# Patient Record
Sex: Female | Born: 1970 | Race: White | Hispanic: No | Marital: Married | Smoking: Former smoker
Health system: Southern US, Community
[De-identification: ages and names within clinical notes are randomized; demographics above are authoritative.]

## PROBLEM LIST (undated history)

## (undated) DIAGNOSIS — O039 Complete or unspecified spontaneous abortion without complication: Secondary | ICD-10-CM

## (undated) DIAGNOSIS — K3532 Acute appendicitis with perforation and localized peritonitis, without abscess: Secondary | ICD-10-CM

## (undated) DIAGNOSIS — K219 Gastro-esophageal reflux disease without esophagitis: Secondary | ICD-10-CM

## (undated) DIAGNOSIS — N390 Urinary tract infection, site not specified: Secondary | ICD-10-CM

## (undated) DIAGNOSIS — R51 Headache: Secondary | ICD-10-CM

## (undated) HISTORY — DX: Urinary tract infection, site not specified: N39.0

## (undated) HISTORY — DX: Acute appendicitis with perforation, localized peritonitis, and gangrene, without abscess: K35.32

## (undated) HISTORY — PX: DIAGNOSTIC LAPAROSCOPY: SUR761

## (undated) HISTORY — PX: DILATION AND CURETTAGE OF UTERUS: SHX78

## (undated) HISTORY — DX: Complete or unspecified spontaneous abortion without complication: O03.9

## (undated) HISTORY — PX: APPENDECTOMY: SHX54

---

## 1998-01-12 ENCOUNTER — Ambulatory Visit (HOSPITAL_COMMUNITY): Admission: RE | Admit: 1998-01-12 | Discharge: 1998-01-12 | Payer: Self-pay | Admitting: Obstetrics and Gynecology

## 1999-10-22 ENCOUNTER — Other Ambulatory Visit: Admission: RE | Admit: 1999-10-22 | Discharge: 1999-10-22 | Payer: Self-pay | Admitting: Obstetrics and Gynecology

## 2000-08-23 ENCOUNTER — Encounter: Payer: Self-pay | Admitting: Emergency Medicine

## 2000-08-23 ENCOUNTER — Inpatient Hospital Stay (HOSPITAL_COMMUNITY): Admission: EM | Admit: 2000-08-23 | Discharge: 2000-08-24 | Payer: Self-pay | Admitting: Emergency Medicine

## 2000-08-23 ENCOUNTER — Encounter (INDEPENDENT_AMBULATORY_CARE_PROVIDER_SITE_OTHER): Payer: Self-pay | Admitting: *Deleted

## 2000-09-18 ENCOUNTER — Encounter: Payer: Self-pay | Admitting: Obstetrics and Gynecology

## 2000-09-18 ENCOUNTER — Ambulatory Visit (HOSPITAL_COMMUNITY): Admission: RE | Admit: 2000-09-18 | Discharge: 2000-09-18 | Payer: Self-pay | Admitting: Obstetrics and Gynecology

## 2001-01-20 ENCOUNTER — Other Ambulatory Visit: Admission: RE | Admit: 2001-01-20 | Discharge: 2001-01-20 | Payer: Self-pay | Admitting: Obstetrics and Gynecology

## 2001-05-03 ENCOUNTER — Inpatient Hospital Stay (HOSPITAL_COMMUNITY): Admission: AD | Admit: 2001-05-03 | Discharge: 2001-05-03 | Payer: Self-pay | Admitting: *Deleted

## 2001-06-22 ENCOUNTER — Inpatient Hospital Stay (HOSPITAL_COMMUNITY): Admission: AD | Admit: 2001-06-22 | Discharge: 2001-06-22 | Payer: Self-pay | Admitting: Obstetrics and Gynecology

## 2001-06-24 ENCOUNTER — Inpatient Hospital Stay (HOSPITAL_COMMUNITY): Admission: AD | Admit: 2001-06-24 | Discharge: 2001-06-24 | Payer: Self-pay | Admitting: Obstetrics and Gynecology

## 2001-06-27 ENCOUNTER — Inpatient Hospital Stay (HOSPITAL_COMMUNITY): Admission: AD | Admit: 2001-06-27 | Discharge: 2001-06-27 | Payer: Self-pay | Admitting: Obstetrics & Gynecology

## 2001-07-11 ENCOUNTER — Inpatient Hospital Stay (HOSPITAL_COMMUNITY): Admission: AD | Admit: 2001-07-11 | Discharge: 2001-07-11 | Payer: Self-pay | Admitting: Obstetrics and Gynecology

## 2001-07-20 ENCOUNTER — Observation Stay (HOSPITAL_COMMUNITY): Admission: AD | Admit: 2001-07-20 | Discharge: 2001-07-20 | Payer: Self-pay | Admitting: Obstetrics and Gynecology

## 2001-07-20 ENCOUNTER — Inpatient Hospital Stay (HOSPITAL_COMMUNITY): Admission: AD | Admit: 2001-07-20 | Discharge: 2001-07-23 | Payer: Self-pay | Admitting: Obstetrics and Gynecology

## 2001-07-24 ENCOUNTER — Encounter: Admission: RE | Admit: 2001-07-24 | Discharge: 2001-08-23 | Payer: Self-pay | Admitting: Obstetrics and Gynecology

## 2001-08-25 ENCOUNTER — Other Ambulatory Visit: Admission: RE | Admit: 2001-08-25 | Discharge: 2001-08-25 | Payer: Self-pay | Admitting: Obstetrics and Gynecology

## 2001-12-23 ENCOUNTER — Encounter: Admission: RE | Admit: 2001-12-23 | Discharge: 2001-12-23 | Payer: Self-pay | Admitting: Family Medicine

## 2001-12-23 ENCOUNTER — Encounter: Payer: Self-pay | Admitting: Family Medicine

## 2002-12-15 ENCOUNTER — Other Ambulatory Visit: Admission: RE | Admit: 2002-12-15 | Discharge: 2002-12-15 | Payer: Self-pay | Admitting: Obstetrics and Gynecology

## 2003-01-11 ENCOUNTER — Inpatient Hospital Stay (HOSPITAL_COMMUNITY): Admission: AD | Admit: 2003-01-11 | Discharge: 2003-01-11 | Payer: Self-pay | Admitting: Obstetrics and Gynecology

## 2004-01-31 ENCOUNTER — Ambulatory Visit (HOSPITAL_BASED_OUTPATIENT_CLINIC_OR_DEPARTMENT_OTHER): Admission: RE | Admit: 2004-01-31 | Discharge: 2004-01-31 | Payer: Self-pay | Admitting: Internal Medicine

## 2004-02-16 ENCOUNTER — Other Ambulatory Visit: Admission: RE | Admit: 2004-02-16 | Discharge: 2004-02-16 | Payer: Self-pay | Admitting: Obstetrics and Gynecology

## 2005-12-02 ENCOUNTER — Other Ambulatory Visit: Admission: RE | Admit: 2005-12-02 | Discharge: 2005-12-02 | Payer: Self-pay | Admitting: Obstetrics and Gynecology

## 2006-11-11 ENCOUNTER — Inpatient Hospital Stay (HOSPITAL_COMMUNITY): Admission: AD | Admit: 2006-11-11 | Discharge: 2006-11-11 | Payer: Self-pay | Admitting: Obstetrics and Gynecology

## 2006-12-17 ENCOUNTER — Encounter (INDEPENDENT_AMBULATORY_CARE_PROVIDER_SITE_OTHER): Payer: Self-pay | Admitting: Specialist

## 2006-12-17 ENCOUNTER — Ambulatory Visit (HOSPITAL_COMMUNITY): Admission: RE | Admit: 2006-12-17 | Discharge: 2006-12-17 | Payer: Self-pay | Admitting: Obstetrics and Gynecology

## 2008-04-12 ENCOUNTER — Ambulatory Visit (HOSPITAL_COMMUNITY): Admission: RE | Admit: 2008-04-12 | Discharge: 2008-04-12 | Payer: Self-pay | Admitting: Obstetrics and Gynecology

## 2011-04-04 NOTE — Op Note (Signed)
[place]  Patient:    GILBERTO, STRECK Visit Number: [id number] MRN: [id number]          Service Type: MED Location: [phone] Attending Physician:  Glenna Fellows Tappan Proc. Date: 08/23/00 Admit Date:  08/23/2000                             Operative Report  PREOPERATIVE DIAGNOSIS:  Acute appendicitis.  POSTOPERATIVE DIAGNOSIS:  Acute appendicitis.  PROCEDURE:  Laparoscopic appendectomy.  SURGEON:  Lorne Skeens. Hoxworth, M.D.  ANESTHESIA:  General.  BRIEF HISTORY:  The patient is a 40 year old white female who presented with signs and symptoms compatible with acute appendicitis which had been confirmed by CT scan.  Laparoscopic appendectomy has been recommended and accepted.  The nature of the procedure, its indications, and risks of bleeding and infection were discussed and understood preoperatively.  She is now brought to the operating room for this procedure.  DESCRIPTION OF PROCEDURE:  The patient was brought to the operating room and placed in the supine position on the operating table and general endotracheal anesthesia was induced.  Broad spectrum antibiotics had been given preoperatively.  The abdomen was sterilely prepped and draped.  A Foley catheter was placed.  An open Roseanne Reno approach was used through a small umbilical incision through a pursestring suture of 0 Vicryl.  Under direct vision, a 5 mm trocar was placed in the right upper quadrant and a 12 mm trocar placed in the left lower quadrant.  The appendix then was visualized laterally and was acutely inflamed without gangrene or perforation.  The appendix was mobilized, dividing lateral peritoneal attachments and was completely freed and elevated.  The mesoappendix was dissected away from the appendix at its base.  The mesoappendix was divided with a single firing of the Endo-GIA vascular stapler and there was no bleeding from the staple line. The base of the  appendix was not inflamed and was well-exposed.  The appendix was divided at its base with a second firing of the Endo-GIA 3.5 mm stapler.  Both staple lines were inspected and were intact.  The right lower quadrant was irrigated and inspected for hemostasis which was complete.  The appendix was placed in an Endocatch bag and brought out through the umbilicus.  All CO2 was evacuated from the peritoneal cavity and the pursestring suture was secured at the umbilicus.  Skin incisions were closed with a running subcuticular 4-0 Vicryl and Steri-Strips.  Sponge and needle counts were correct.  A dry sterile dressing was applied and the patient was taken to the recovery room in good condition. Attending Physician:  Delsa Bern DD:  08/23/00 TD:  08/24/00 Job: [id number]

## 2011-04-04 NOTE — Op Note (Signed)
NAMEREKITA, MIOTKE                ACCOUNT NO.:  [id number]   MEDICAL RECORD NO.:  [id number]          PATIENT TYPE:  AMB   LOCATION:  SDC                           FACILITY:  [facility]   PHYSICIAN:  Guy Sandifer. Henderson Cloud, M.D. DATE OF BIRTH:  [birth year]   DATE OF PROCEDURE:  12/17/2006  DATE OF DISCHARGE:                               OPERATIVE REPORT   PREOPERATIVE DIAGNOSIS:  Spontaneous abortion.   POSTOPERATIVE DIAGNOSIS:  Spontaneous abortion.   PROCEDURE:  Dilation and curettage and 1% Xylocaine paracervical block.   SURGEON:  Harold Hedge, M.D.   ANESTHESIA:  MAC.   SPECIMENS:  Products of conception to pathology and for karyotype.   ESTIMATED BLOOD LOSS:  Minimal.   INDICATIONS AND CONSENT:  This patient is a 40 year old, married, white  female, G4, P1, with a spontaneous miscarriage.  Alternate methods of  management have been discussed, and the patient requests dilatation and  evacuation.  Potential risks and complications have been discussed  preoperatively.   PROCEDURE:  The patient taken to the operating room where she was  identified, placed in dorsal supine position and given intravenous  sedation.  She is then placed in the dorsal lithotomy position, prepped,  bladder straight-catheterized and draped in a sterile fashion.  Examination reveals the uterus to be 6-8 weeks in size.  Bivalve  speculum is placed in the vagina.  Anterior cervical lip is injected  with 1% Xylocaine plain and grasped with a single-tooth tenaculum.  Paracervical block is then placed at the 2, 4, 5, 7, 8, and 10 o'clock  positions with approximately 20 mL total of plain 1% Xylocaine.  Cervix  was gently progressively dilated to a 31 dilator.  Number 7 curved  curettage is then passed and suction curettage is carried out for  obvious products of conception.  Twenty units of Pitocin are added to 1  liter of IV fluids after the initial pass of the curette.  Alternating  sharp and suction curettage  is carried out until the cavity is clean and  good hemostasis is noted.  All instruments are removed.  All counts are  correct.  Portion of the products of conception will be sent for  karyotype.      Guy Sandifer Henderson Cloud, M.D.  Electronically Signed     JET/MEDQ  D:  12/17/2006  T:  12/17/2006  Job:  [id number]

## 2011-07-25 ENCOUNTER — Encounter: Payer: Self-pay | Admitting: Internal Medicine

## 2011-08-18 ENCOUNTER — Telehealth: Payer: Self-pay | Admitting: Internal Medicine

## 2011-08-18 ENCOUNTER — Ambulatory Visit: Payer: Self-pay | Admitting: Internal Medicine

## 2011-08-18 NOTE — Telephone Encounter (Signed)
Do not bill 

## 2011-09-04 ENCOUNTER — Encounter: Payer: Self-pay | Admitting: Internal Medicine

## 2011-09-10 ENCOUNTER — Encounter: Payer: Self-pay | Admitting: Internal Medicine

## 2011-09-12 ENCOUNTER — Telehealth: Payer: Self-pay | Admitting: Internal Medicine

## 2011-09-12 ENCOUNTER — Ambulatory Visit: Payer: Self-pay | Admitting: Internal Medicine

## 2011-09-12 NOTE — Telephone Encounter (Signed)
No do not bill  

## 2011-10-01 ENCOUNTER — Ambulatory Visit: Payer: Self-pay | Admitting: Internal Medicine

## 2011-10-01 ENCOUNTER — Telehealth: Payer: Self-pay | Admitting: Internal Medicine

## 2011-12-30 ENCOUNTER — Telehealth: Payer: Self-pay | Admitting: Internal Medicine

## 2011-12-30 NOTE — Telephone Encounter (Signed)
Pt reports painful , bleeding hemorrhoids and the anusol suppositories don't seem to be helping. She reports she is showers frequently d/t the pain. Instructed pt on Sitz baths instead of showering to try to shrink the hemorrhoids. Pt r/s to tomorrow with Dr Rhea Belton.

## 2011-12-31 ENCOUNTER — Encounter: Payer: Self-pay | Admitting: Internal Medicine

## 2011-12-31 ENCOUNTER — Ambulatory Visit (INDEPENDENT_AMBULATORY_CARE_PROVIDER_SITE_OTHER): Payer: BC Managed Care – PPO | Admitting: Internal Medicine

## 2011-12-31 VITALS — BP 98/60 | HR 80 | Ht 67.0 in | Wt 194.8 lb

## 2011-12-31 DIAGNOSIS — K648 Other hemorrhoids: Secondary | ICD-10-CM

## 2011-12-31 DIAGNOSIS — K649 Unspecified hemorrhoids: Secondary | ICD-10-CM | POA: Insufficient documentation

## 2011-12-31 DIAGNOSIS — K602 Anal fissure, unspecified: Secondary | ICD-10-CM | POA: Insufficient documentation

## 2011-12-31 MED ORDER — HYDROCORTISONE ACE-PRAMOXINE 2.5-1 % RE CREA: TOPICAL_CREAM | Freq: Three times a day (TID) | RECTAL | Status: AC

## 2011-12-31 MED ORDER — DILTIAZEM GEL 2 %: 1.0000 "application " | Freq: Three times a day (TID) | CUTANEOUS | Status: DC

## 2011-12-31 MED ORDER — LIDOCAINE 5 % EX OINT: TOPICAL_OINTMENT | Freq: Three times a day (TID) | CUTANEOUS | Status: DC | PRN

## 2011-12-31 NOTE — Progress Notes (Signed)
Subjective:    Patient ID: Casey Perez, female    DOB: [birth year], 41 y.o.   MRN: [id number]  HPI Mrs. Stillman is a 41 year old female with little PMH who presents for evaluation of anal pain and intermittent rectal bleeding.  The patient reports she's had issues with "hemorrhoids" since 2008, however over the last 4 or 5 months her symptoms have become more severe. She was scheduled to see me in October 2012, but ended up canceling this appointment because her symptoms improved transiently.  Her symptoms returned approximately 3 or 4 weeks ago, and this prompted her to reschedule. Reports trouble with perianal burning, itching, some leakage of mucus, and occasional bright red blood with wiping. She reports extreme pain with passage of bowel movement. Currently she is having approximately 2 bowel movements daily, her stools are formed but not hard. She denies constipation or diarrhea. Her perianal pain is often worse for a few minutes to an hour after bowel movement, and is sometimes worse with sitting. She denies abdominal pain. No nausea and vomiting. Occasional heartburn, but this is not a problem for her. No dysphagia. No fevers or chills.  Regarding her anorectum, she is using diltiazem gel 4 times daily over the last 3 weeks. She reports this helps some, but has not been completely effective. She has been using ibuprofen 800 mg every 8 hours, and she feels this has helped more than anything.  Review of Systems As per history of present illness, otherwise negative  Past Medical History  Diagnosis Date  . Ruptured appendix   . Hemorrhoids   . UTI (lower urinary tract infection)   . Miscarriage    Past Surgical History  Procedure Date  . Appendectomy    Current Outpatient Prescriptions  Medication Sig Dispense Refill  . busPIRone (BUSPAR) 10 MG tablet Take 10 mg by mouth 2 (two) times daily.      Marland Kitchen diltiazem 2 % GEL Apply 1 application topically 3 (three) times daily.  30 g  1    Allergies  Allergen Reactions  . Sulfa Antibiotics    Family History  Problem Relation Age of Onset  . Hypertension Father   . Diabetes Father    Social History  . Marital Status: Married    Number of Children: 1   Social History Main Topics  . Smoking status: Former Smoker    Quit date: 02/28/2011  . Smokeless tobacco: Never Used  . Alcohol Use: 2.0 oz/week    4 drink(s) per week     occasional  . Drug Use: No      Objective:   Physical Exam BP 98/60  Pulse 80  Ht 5\' 7"  (1.702 m)  Wt 194 lb 12.8 oz (88.361 kg)  BMI 30.51 kg/m2  LMP 12/17/2011 Constitutional: Well-developed and well-nourished. No distress. HEENT: Normocephalic and atraumatic. Oropharynx is clear and moist. No oropharyngeal exudate. Conjunctivae are normal. Pupils are equal round and reactive to light. No scleral icterus. Neck: Neck supple. Trachea midline. Cardiovascular: Normal rate, regular rhythm and intact distal pulses. No M/R/G Pulmonary/chest: Effort normal and breath sounds normal. No wheezing, rales or rhonchi. Abdominal: Soft, nontender, nondistended. Bowel sounds active throughout. There are no masses palpable. No hepatosplenomegaly. Rectal: Posterior midline superficial laceration consistent with fissure, there is also a small superficial fissure anteriorly, internal exam somewhat limited by pain with presence of formed brown stool in the vault, no masses. No external hemorrhoids seen. Extremities: no clubbing, cyanosis, or edema Lymphadenopathy: No cervical adenopathy noted. Neurological: Alert  and oriented to person place and time. Skin: Skin is warm and dry. No rashes noted. Psychiatric: Normal mood and affect. Behavior is normal.     Assessment & Plan:  41 year old female with little PMH who presents for evaluation of anal pain and intermittent rectal bleeding  1. Anorectal pain -- the patient's pain and symptoms are most consistent with anal fissure as seen on rectal exam. It  certainly is possible she has internal hemorrhoids as well.  Anal fissures can take as long as 6 weeks to heal, and she is using diltiazem gel 3 times a day.  I've recommended that she continue this to total 6 weeks. I also will give her prescription for Analpram to be used 3 times daily.  Finally I will give her some topical lidocaine 2% gel to help with the pain.  If her symptoms fail to improve, we could try a different therapy such as topical nitroglycerin.  Also, I may consider flexible sigmoidoscopy with moderate sedation to obtain a better exam.  We have discussed how surgery is an option if this fails to heal over time with medical therapy.  I've asked that she let us know how she's doing in about 5 days, she will follow with me in 2 weeks.

## 2011-12-31 NOTE — Patient Instructions (Addendum)
We have sent the following medications to your pharmacy for you to pick up at your convenience: Analpram, lidocane gel  Dr. Rhea Belton would like to see you back in the office in 2 weeks for a follow up

## 2012-01-05 ENCOUNTER — Ambulatory Visit: Payer: Self-pay | Admitting: Internal Medicine

## 2012-01-13 ENCOUNTER — Ambulatory Visit: Payer: BC Managed Care – PPO | Admitting: Internal Medicine

## 2012-01-19 ENCOUNTER — Encounter: Payer: Self-pay | Admitting: Internal Medicine

## 2012-01-20 ENCOUNTER — Encounter: Payer: Self-pay | Admitting: Internal Medicine

## 2012-01-20 ENCOUNTER — Ambulatory Visit (INDEPENDENT_AMBULATORY_CARE_PROVIDER_SITE_OTHER): Payer: BC Managed Care – PPO | Admitting: Internal Medicine

## 2012-01-20 DIAGNOSIS — K648 Other hemorrhoids: Secondary | ICD-10-CM

## 2012-01-20 DIAGNOSIS — K602 Anal fissure, unspecified: Secondary | ICD-10-CM

## 2012-01-20 MED ORDER — HYDROCORTISONE ACE-PRAMOXINE 2.5-1 % RE CREA: TOPICAL_CREAM | Freq: Three times a day (TID) | RECTAL | Status: DC

## 2012-01-20 NOTE — Progress Notes (Signed)
  Subjective:    Patient ID: Casey Perez, female    DOB: 05/19/1971, 41 y.o.   MRN: [id number]  HPI [name] returns today 2 weeks after being seen for hemorrhoidal pain/itching as well as anal fissure. After her last visit she started Analpram and continue the use of diltiazem gel. Overall she reports dramatic improvement in her symptoms, specifically she is no longer having rectal bleeding and she is not having pain with defecation. She continues to deny trouble with constipation or the need to strain at stool. She does feel some perianal itching and "swelling", now that she has run out of Analpram. She is requesting a refill of this today. Still no abdominal pain, nausea, vomiting. No bright red blood per rectum or melena.  No fevers or chills.  Review of Systems As per history of present illness, otherwise negative  Current Medications, Allergies, Past Medical History, Past Surgical History, Family History and Social History were reviewed in [employer] record.     Objective:   Physical Exam BP 130/82  Pulse 101  Ht 5\' 7"  (1.702 m)  Wt 190 lb 9.6 oz (86.456 kg)  BMI 29.85 kg/m2  SpO2 97%  LMP 01/09/2012 Gen: Pleasant, alert and oriented, in no acute distress     Assessment & Plan:  41 year old female with little PMH who presents for evaluation of anal pain and intermittent rectal bleeding   1. Anorectal pain/hx of fissure/internal hemorrhoids -- the patient's pain and symptoms are much improved after completing diltiazem gel for approximately 6 weeks.  The Analpram also helped with her hemorrhoidal pain and itching. She did use a lidocaine topically for several days, but discontinued once her pain stopped. She has not needed this since.  She would like a refill of her Analpram and diltiazem should her symptoms return.  She would like to use the hemorrhoidal medicine for several additional weeks to ensure her symptoms resolve completely. This is a reasonable  plan. She also asked about surgical options for hemorrhoidal disease should her symptoms recur frequently or fail to completely resolve .  For now the plan will be for her to contact us in 4 weeks' time to hopefully document resolution of her symptoms.  If her symptoms do resolve she can followup on an as-needed basis. If they do not, she will be reevaluated and we will consider visualization with endoscopy at that time. I've also asked her to make me aware if her pain or bleeding return. She voiced understanding.

## 2012-01-20 NOTE — Patient Instructions (Signed)
We have sent the following medications to your pharmacy for you to pick up at your convenience: Adron Bene has been sent to [facility].  Diltiazem has been sent to [facility].  Please call the office in 1 month to update Dr. Rhea Belton on your symptoms and follow up with him as needed.

## 2012-03-12 ENCOUNTER — Telehealth: Payer: Self-pay | Admitting: Internal Medicine

## 2012-03-12 NOTE — Telephone Encounter (Signed)
lmom for pt to call back

## 2012-03-16 NOTE — Telephone Encounter (Signed)
Pt and I are playing phone tag, but she did leave me a message. Pt left message stating she was still having problems with her hemorrhoids and you stated if she continues to have problems, you would refer her to a surgeon. Please advise. Thanks.

## 2012-03-16 NOTE — Telephone Encounter (Signed)
Would refill Analpram and use BID, also rectal exam with fissures in the past which diltiazem gel helped.  I rec restarting this also. Okay for surgery referral per pt request.

## 2012-03-17 ENCOUNTER — Telehealth: Payer: Self-pay | Admitting: *Deleted

## 2012-03-17 NOTE — Telephone Encounter (Signed)
Mailed pt her appt info with Dr Luisa Hart and [facility] with the appt for 04/02/12.

## 2012-03-17 NOTE — Telephone Encounter (Signed)
lmom for pt to let me know if she has a preference in a Careers adviser. Also, I can refill her meds for hemorrhoids as well as the diltiazem for previous rectal fissures.

## 2012-03-17 NOTE — Telephone Encounter (Signed)
Pt reports she still has scripts for the meds. She prefers Dr Gerrit Friends, but 1st available will work.

## 2012-04-01 ENCOUNTER — Encounter (INDEPENDENT_AMBULATORY_CARE_PROVIDER_SITE_OTHER): Payer: Self-pay | Admitting: Surgery

## 2012-04-02 ENCOUNTER — Encounter (INDEPENDENT_AMBULATORY_CARE_PROVIDER_SITE_OTHER): Payer: Self-pay | Admitting: Surgery

## 2012-04-02 ENCOUNTER — Ambulatory Visit (INDEPENDENT_AMBULATORY_CARE_PROVIDER_SITE_OTHER): Payer: BC Managed Care – PPO | Admitting: Surgery

## 2012-04-02 DIAGNOSIS — K602 Anal fissure, unspecified: Secondary | ICD-10-CM

## 2012-04-02 DIAGNOSIS — K649 Unspecified hemorrhoids: Secondary | ICD-10-CM

## 2012-04-02 MED ORDER — HYDROCORTISONE ACE-PRAMOXINE 1-1 % RE FOAM: 1.0000 | Freq: Two times a day (BID) | RECTAL | Status: AC

## 2012-04-02 NOTE — Patient Instructions (Signed)
Anal Fissure, Adult An anal fissure is a small tear or crack in the skin around the anus. Bleeding from a fissure usually stops on its own within a few minutes. However, bleeding will often reoccur with each bowel movement until the crack heals.  CAUSES   Passing large, hard stools.   Frequent diarrheal stools.   Constipation.   Inflammatory bowel disease (Crohn's disease or ulcerative colitis).   Infections.   Anal sex.  SYMPTOMS   Small amounts of blood seen on your stools, on toilet paper, or in the toilet after a bowel movement.   Rectal bleeding.   Painful bowel movements.   Itching or irritation around the anus.  DIAGNOSIS Your caregiver will examine the anal area. An anal fissure can usually be seen with careful inspection. A rectal exam may be performed and a short tube (anoscope) may be used to examine the anal canal. TREATMENT   You may be instructed to take fiber supplements. These supplements can soften your stool to help make bowel movements easier.   Sitz baths may be recommended to help heal the tear. Do not use soap in the sitz baths.   A medicated cream or ointment may be prescribed to lessen discomfort.  HOME CARE INSTRUCTIONS   Maintain a diet high in fruits, whole grains, and vegetables. Avoid constipating foods like bananas and dairy products.   Take sitz baths as directed by your caregiver.   Drink enough fluids to keep your urine clear or pale yellow.   Only take over-the-counter or prescription medicines for pain, discomfort, or fever as directed by your caregiver. Do not take aspirin as this may increase bleeding.   Do not use ointments containing numbing medications (anesthetics) or hydrocortisone. They could slow healing.  SEEK MEDICAL CARE IF:   Your fissure is not completely healed within 3 days.   You have further bleeding.   You have a fever.   You have diarrhea mixed with blood.   You have pain.   Your problem is getting worse  rather than better.  MAKE SURE YOU:   Understand these instructions.   Will watch your condition.   Will get help right away if you are not doing well or get worse.  Document Released: 11/03/2005 Document Revised: 10/23/2011 Document Reviewed: 04/20/2011 [facility] Patient Information 2012 [place], [place].   Hemorrhoids Hemorrhoids are enlarged (dilated) veins around the rectum. There are 2 types of hemorrhoids, and the type of hemorrhoid is determined by its location. Internal hemorrhoids occur in the veins just inside the rectum.They are usually not painful, but they may bleed.However, they may poke through to the outside and become irritated and painful. External hemorrhoids involve the veins outside the anus and can be felt as a painful swelling or hard lump near the anus.They are often itchy and may crack and bleed. Sometimes clots will form in the veins. This makes them swollen and painful. These are called thrombosed hemorrhoids. CAUSES Causes of hemorrhoids include:  Pregnancy. This increases the pressure in the hemorrhoidal veins.   Constipation.   Straining to have a bowel movement.   Obesity.   Heavy lifting or other activity that caused you to strain.  TREATMENT Most of the time hemorrhoids improve in 1 to 2 weeks. However, if symptoms do not seem to be getting better or if you have a lot of rectal bleeding, your caregiver may perform a procedure to help make the hemorrhoids get smaller or remove them completely.Possible treatments include:  Rubber band  ligation. A rubber band is placed at the base of the hemorrhoid to cut off the circulation.   Sclerotherapy. A chemical is injected to shrink the hemorrhoid.   Infrared light therapy. Tools are used to burn the hemorrhoid.   Hemorrhoidectomy. This is surgical removal of the hemorrhoid.  HOME CARE INSTRUCTIONS   Increase fiber in your diet. Ask your caregiver about using fiber supplements.   Drink enough water and  fluids to keep your urine clear or pale yellow.   Exercise regularly.   Go to the bathroom when you have the urge to have a bowel movement. Do not wait.   Avoid straining to have bowel movements.   Keep the anal area dry and clean.   Only take over-the-counter or prescription medicines for pain, discomfort, or fever as directed by your caregiver.  If your hemorrhoids are thrombosed:  Take warm sitz baths for 20 to 30 minutes, 3 to 4 times per day.   If the hemorrhoids are very tender and swollen, place ice packs on the area as tolerated. Using ice packs between sitz baths may be helpful. Fill a plastic bag with ice. Place a towel between the bag of ice and your skin.   Medicated creams and suppositories may be used or applied as directed.   Do not use a donut-shaped pillow or sit on the toilet for long periods. This increases blood pooling and pain.  SEEK MEDICAL CARE IF:   You have increasing pain and swelling that is not controlled with your medicine.   You have uncontrolled bleeding.   You have difficulty or you are unable to have a bowel movement.   You have pain or inflammation outside the area of the hemorrhoids.   You have chills or an oral temperature above 102 F (38.9 C).  MAKE SURE YOU:   Understand these instructions.   Will watch your condition.   Will get help right away if you are not doing well or get worse.  Document Released: 10/31/2000 Document Revised: 10/23/2011 Document Reviewed: 03/07/2008 [facility] Patient Information 2012 [place], [place].

## 2012-04-02 NOTE — Progress Notes (Signed)
Patient ID: Casey Perez, female   DOB: 12/24/1970, 41 y.o.   MRN: [id number]  Chief Complaint  Patient presents with  . Hemorrhoids    est pt    HPI Casey Perez is a 41 y.o. female.   HPI  Patient sent at the request of Dr. Rhea Belton due to hemorrhoids and history of anal fissure. She has a long-standing history of rectal itching, burning, pain, bleeding and overall discomfort. This has waxed and waned over the years. She has tried Risk analyst and has had some success with that but the condition is not resolve completely.  Past Medical History  Diagnosis Date  . Ruptured appendix   . Hemorrhoids   . UTI (lower urinary tract infection)   . Miscarriage     Past Surgical History  Procedure Date  . Appendectomy     Family History  Problem Relation Age of Onset  . Hypertension Father   . Diabetes Father   . Hypertension Mother     Social History History  Substance Use Topics  . Smoking status: Former Smoker    Quit date: 02/28/2011  . Smokeless tobacco: Never Used  . Alcohol Use: 2.0 oz/week    4 drink(s) per week     occasional    Allergies  Allergen Reactions  . Sulfa Antibiotics     Current Outpatient Prescriptions  Medication Sig Dispense Refill  . busPIRone (BUSPAR) 10 MG tablet Take 10 mg by mouth 2 (two) times daily.      Marland Kitchen diltiazem 2 % GEL Apply 1 application topically 3 (three) times daily.  30 g  1  . hydrocortisone-pramoxine (ANALPRAM-HC) 2.5-1 % rectal cream Place rectally 3 (three) times daily. As needed  30 g  3  . lidocaine (XYLOCAINE) 5 % ointment Apply topically 3 (three) times daily as needed.  35.44 g  0  . valACYclovir (VALTREX) 500 MG tablet Ad lib.      Marland Kitchen VICODIN 5-300 MG TABS Ad lib.      . hydrocortisone-pramoxine (PROCTOFOAM HC) rectal foam Place 1 applicator rectally 2 (two) times daily.  10 g  0    Review of Systems Review of Systems  Constitutional: Negative for fever, chills and unexpected weight change.  HENT: Negative for  hearing loss, congestion, sore throat, trouble swallowing and voice change.   Eyes: Negative for visual disturbance.  Respiratory: Negative for cough and wheezing.   Cardiovascular: Negative for chest pain, palpitations and leg swelling.  Gastrointestinal: Positive for anal bleeding and rectal pain. Negative for nausea, vomiting, abdominal pain, diarrhea, constipation, blood in stool and abdominal distention.  Genitourinary: Negative for hematuria, vaginal bleeding and difficulty urinating.  Musculoskeletal: Negative for arthralgias.  Skin: Negative for rash and wound.  Neurological: Negative for seizures, syncope and headaches.  Hematological: Negative for adenopathy. Does not bruise/bleed easily.  Psychiatric/Behavioral: Negative for confusion.    Blood pressure 136/88, pulse 84, temperature 97.7 F (36.5 C), temperature source Temporal, resp. rate 18, height 5' 6.75" (1.695 m), weight 190 lb 6.4 oz (86.365 kg).  Physical Exam Physical Exam  Constitutional: She is oriented to person, place, and time. She appears well-developed and well-nourished.  HENT:  Head: Normocephalic and atraumatic.  Eyes: EOM are normal. Pupils are equal, round, and reactive to light.  Neck: Normal range of motion. Neck supple.  Cardiovascular: Normal rate and regular rhythm.   Pulmonary/Chest: Effort normal and breath sounds normal.  Abdominal: Soft. Bowel sounds are normal. She exhibits no distension.  Genitourinary:  Musculoskeletal: Normal range of motion.  Neurological: She is alert and oriented to person, place, and time.  Skin: Skin is warm and dry.  Psychiatric: She has a normal mood and affect. Her behavior is normal. Judgment and thought content normal.    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 Continue medical management versus office intervention versus  hemorrhoidectomy. She has a nonhealing anal fissure and this will require lateral internal sphincterotomy since sphincter is exposed. Her hemorrhoids could be dealt with at the same time. She has concerns about office-based treatment.  She will return when she is ready to set up surgery.       Casey Perez A. 04/02/2012, 11:49 AM

## 2012-04-13 ENCOUNTER — Other Ambulatory Visit: Payer: Self-pay | Admitting: Gastroenterology

## 2012-04-13 DIAGNOSIS — K602 Anal fissure, unspecified: Secondary | ICD-10-CM

## 2012-04-13 DIAGNOSIS — K648 Other hemorrhoids: Secondary | ICD-10-CM

## 2012-04-13 MED ORDER — HYDROCORTISONE ACE-PRAMOXINE 2.5-1 % RE CREA: TOPICAL_CREAM | Freq: Three times a day (TID) | RECTAL | Status: DC

## 2012-07-29 ENCOUNTER — Telehealth: Payer: Self-pay | Admitting: Internal Medicine

## 2012-07-29 DIAGNOSIS — K602 Anal fissure, unspecified: Secondary | ICD-10-CM

## 2012-07-29 DIAGNOSIS — K648 Other hemorrhoids: Secondary | ICD-10-CM

## 2012-07-29 MED ORDER — DILTIAZEM GEL 2 %: 1.0000 "application " | Freq: Three times a day (TID) | CUTANEOUS | Status: DC

## 2012-07-29 MED ORDER — HYDROCORTISONE ACE-PRAMOXINE 2.5-1 % RE CREA: TOPICAL_CREAM | Freq: Three times a day (TID) | RECTAL | Status: DC

## 2012-07-29 MED ORDER — HYDROCORTISONE ACE-PRAMOXINE 1-1 % RE FOAM: 1.0000 | Freq: Two times a day (BID) | RECTAL | Status: AC

## 2012-07-29 NOTE — Telephone Encounter (Signed)
Okay to refill while she is waiting to schedule surgery.  Thanks

## 2012-07-29 NOTE — Telephone Encounter (Signed)
lmom for pt to call back; does she want the meds called to [facility] and did they fill the diltiazem gel?

## 2012-07-29 NOTE — Telephone Encounter (Signed)
Pt asked that Diltiazem be called to [facility] and the other 2 meds be sent to [facility]; done.

## 2012-07-29 NOTE — Telephone Encounter (Signed)
Pt was referred to Dr Luisa Hart by [place] for hemorrhoids and a hx of an anal fissure. She was to call Dr Luisa Hart back to schedule surgery which will require a sphincterotomy as well. Pt reports she has a child and did not want to have surgery over the summer, but since she has problems again is going to call him to schedule surgery. Pt would like for [place] to refill the Proctofoam, Analpram and Diltiazem; OK to order? Thanks.

## 2012-09-06 ENCOUNTER — Ambulatory Visit (INDEPENDENT_AMBULATORY_CARE_PROVIDER_SITE_OTHER): Payer: BC Managed Care – PPO | Admitting: Surgery

## 2012-09-06 ENCOUNTER — Encounter (INDEPENDENT_AMBULATORY_CARE_PROVIDER_SITE_OTHER): Payer: Self-pay | Admitting: Surgery

## 2012-09-06 VITALS — BP 138/82 | HR 76 | Temp 98.3°F | Resp 16 | Ht 63.0 in | Wt 185.6 lb

## 2012-09-06 DIAGNOSIS — K602 Anal fissure, unspecified: Secondary | ICD-10-CM

## 2012-09-06 DIAGNOSIS — K649 Unspecified hemorrhoids: Secondary | ICD-10-CM

## 2012-09-06 NOTE — Progress Notes (Signed)
Patient ID: Casey Perez, female   DOB: 1971-06-29, 41 y.o.   MRN: [id number]  No chief complaint on file.   HPI Casey Perez is a 41 y.o. female.   HPI  Patient sent at the request of Dr. Rhea Belton due to hemorrhoids and history of anal fissure. She has a long-standing history of rectal itching, burning, pain, bleeding and overall discomfort. This has waxed and waned over the years. She has tried Risk analyst and has had some success with that but the condition is not resolve completely.she returns to discuss surgical options again.  Past Medical History  Diagnosis Date  . Ruptured appendix   . Hemorrhoids   . UTI (lower urinary tract infection)   . Miscarriage     Past Surgical History  Procedure Date  . Appendectomy     Family History  Problem Relation Age of Onset  . Hypertension Father   . Diabetes Father   . Hypertension Mother     Social History History  Substance Use Topics  . Smoking status: Former Smoker    Quit date: 02/28/2011  . Smokeless tobacco: Never Used  . Alcohol Use: 2.0 oz/week    4 drink(s) per week     occasional    Allergies  Allergen Reactions  . Sulfa Antibiotics     Current Outpatient Prescriptions  Medication Sig Dispense Refill  . busPIRone (BUSPAR) 10 MG tablet Take 10 mg by mouth 2 (two) times daily.      Marland Kitchen diltiazem 2 % GEL Apply 1 application topically 3 (three) times daily.  30 g  1  . hydrocortisone-pramoxine (ANALPRAM-HC) 2.5-1 % rectal cream Place rectally 3 (three) times daily. As needed  30 g  3  . lidocaine (XYLOCAINE) 5 % ointment Apply topically 3 (three) times daily as needed.  35.44 g  0  . valACYclovir (VALTREX) 500 MG tablet Ad lib.      Marland Kitchen VICODIN 5-300 MG TABS Ad lib.        Review of Systems Review of Systems  Constitutional: Negative for fever, chills and unexpected weight change.  HENT: Negative for hearing loss, congestion, sore throat, trouble swallowing and voice change.   Eyes: Negative for visual  disturbance.  Respiratory: Negative for cough and wheezing.   Cardiovascular: Negative for chest pain, palpitations and leg swelling.  Gastrointestinal: Positive for anal bleeding and rectal pain. Negative for nausea, vomiting, abdominal pain, diarrhea, constipation, blood in stool and abdominal distention.  Genitourinary: Negative for hematuria, vaginal bleeding and difficulty urinating.  Musculoskeletal: Negative for arthralgias.  Skin: Negative for rash and wound.  Neurological: Negative for seizures, syncope and headaches.  Hematological: Negative for adenopathy. Does not bruise/bleed easily.  Psychiatric/Behavioral: Negative for confusion.    Blood pressure 138/82, pulse 76, temperature 98.3 F (36.8 C), resp. rate 16, height 5\' 3"  (1.6 m), weight 185 lb 9.6 oz (84.188 kg).  Physical Exam Physical Exam  Constitutional: She is oriented to person, place, and time. She appears well-developed and well-nourished.  HENT:  Head: Normocephalic and atraumatic.  Eyes: EOM are normal. Pupils are equal, round, and reactive to light.  Neck: Normal range of motion. Neck supple.  Cardiovascular: Normal rate and regular rhythm.   Pulmonary/Chest: Effort normal and breath sounds normal.  Abdominal: Soft. Bowel sounds are normal. She exhibits no distension.  Genitourinary:   grade 3 internal external hemorhoids 3 columns and non healed anal fissure posterior midline  Musculoskeletal: Normal range of motion.  Neurological: She is alert and oriented to  person, place, and time.  Skin: Skin is warm and dry.  Psychiatric: She has a normal mood and affect. Her behavior is normal. Judgment and thought content normal.    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 Continue medical management versus office intervention versus hemorrhoidectomy. She has a nonhealing anal fissure and  this will require lateral internal sphincterotomy since sphincter is exposed. Her hemorrhoids could be dealt with at the same time. She has concerns about office-based treatment.     She would like to proceed with hemorrhoidectomy and lateral internal sphincterotomy. Risks discussed. Alternative therapies discussed. Questions answered.The procedure has been discussed with the patient.  Alternative therapies have been discussed with the patient.  Operative risks include bleeding,  Infection,  Organ injury,  Nerve injury,  Blood vessel injury,  DVT,  Pulmonary embolism,  Death,  And possible reoperation.  Medical management risks include worsening of present situation.  The success of the procedure is 50 -90 % at treating patients symptoms.  The patient understands and agrees to proceed.  Albirta Rhinehart A. 09/06/2012, 12:09 PM

## 2012-09-06 NOTE — Patient Instructions (Addendum)
Anal Fissure, Adult An anal fissure is a small tear or crack in the skin around the opening of the butt (anus).Bleeding from the tear or crack usually stops on its own within a few minutes. The bleeding may happen every time you poop until the tear or crack heals. HOME CARE  Eat lots of fruit, whole grains, and vegetables. Avoid foods like bananas and dairy products. These foods can make it hard to poop.  Take a warm water bath (sitz bath) as told by your doctor.  Drink enough fluids to keep your pee (urine) clear or pale yellow.  Only take medicines as told by your doctor. Do not take aspirin.  Do not use numbing creams or hydrocortisone cream on the area. These creams can slow healing. GET HELP RIGHT AWAY IF:  Your tear or crack is not healed in 3 days.  You have more bleeding.  You have a fever.  You have watery poop (diarrhea) mixed with blood.  You have pain.  You are getting worse, not better. MAKE SURE YOU:   Understand these instructions.  Will watch your condition.  Will get help right away if you are not doing well or get worse. Document Released: 07/02/2011 Document Revised: 01/26/2012 Document Reviewed: 07/02/2011 [facility] Patient Information 2013 [place], [place]. Hemorrhoids Hemorrhoids are veins in the rectum that get big. These veins can get blocked. Blocked veins become puffy (swollen) and painful. HOME CARE  Eat more fiber.  Drink enough fluid to keep your pee (urine) clear or pale yellow.  Exercise often.  Avoid straining to poop (bowel movement).  Keep the butt area dry and clean.  Only take medicine as told by your doctor. If your hemorrhoids are puffy and painful:  Take a warm bath for 20 to 30 minutes. Do this 3 to 4 times a day.  Place ice packs on the area. Use the ice packs between the baths.  Put ice in a plastic bag.  Place a towel between your skin and the bag.  Leave the ice on for 15 to 20 minutes, 3 to 4 times a day.  Do  not use a donut-shaped pillow. Do not sit on the toilet for a long time.  Go to the bathroom when your body has the urge to poop. This is so you do not strain as much to poop. GET HELP RIGHT AWAY IF:   You have increasing pain that is not controlled with medicine.  You have uncontrolled bleeding.  You cannot poop.  You have pain or puffiness outside the area of the hemorrhoids.  You have chills.  You have a temperature by mouth above 102 F (38.9 C), not controlled by medicine. MAKE SURE YOU:   Understand these instructions.  Will watch your condition.  Will get help right away if you are not doing well or get worse. Document Released: 08/12/2008 Document Revised: 01/26/2012 Document Reviewed: 08/12/2008 [facility] Patient Information 2013 [place], [place].

## 2012-09-08 ENCOUNTER — Encounter (HOSPITAL_COMMUNITY): Payer: Self-pay | Admitting: Pharmacy Technician

## 2012-09-14 ENCOUNTER — Ambulatory Visit (HOSPITAL_COMMUNITY)
Admission: RE | Admit: 2012-09-14 | Discharge: 2012-09-14 | Disposition: A | Discharge status: Home / Self Care | Payer: BC Managed Care – PPO | Source: Ambulatory Visit | Attending: Surgery | Admitting: Surgery

## 2012-09-14 ENCOUNTER — Encounter (HOSPITAL_COMMUNITY): Payer: Self-pay

## 2012-09-14 ENCOUNTER — Encounter (HOSPITAL_COMMUNITY)
Admission: RE | Admit: 2012-09-14 | Discharge: 2012-09-14 | Disposition: A | Discharge status: Home / Self Care | Payer: BC Managed Care – PPO | Source: Ambulatory Visit | Attending: Surgery | Admitting: Surgery

## 2012-09-14 DIAGNOSIS — Z01812 Encounter for preprocedural laboratory examination: Secondary | ICD-10-CM | POA: Insufficient documentation

## 2012-09-14 DIAGNOSIS — Z01818 Encounter for other preprocedural examination: Secondary | ICD-10-CM | POA: Insufficient documentation

## 2012-09-14 DIAGNOSIS — Z01810 Encounter for preprocedural cardiovascular examination: Secondary | ICD-10-CM | POA: Insufficient documentation

## 2012-09-14 HISTORY — DX: Headache: R51

## 2012-09-14 HISTORY — DX: Gastro-esophageal reflux disease without esophagitis: K21.9

## 2012-09-14 LAB — CBC WITH DIFFERENTIAL/PLATELET
Eosinophils Absolute: 0.1 10*3/uL (ref 0.0–0.7)
HCT: 39.6 % (ref 36.0–46.0)
Hemoglobin: 13.7 g/dL (ref 12.0–15.0)
Lymphs Abs: 3.7 10*3/uL (ref 0.7–4.0)
MCH: 28.8 pg (ref 26.0–34.0)
Monocytes Absolute: 0.6 10*3/uL (ref 0.1–1.0)
Monocytes Relative: 7 % (ref 3–12)
Neutro Abs: 3.9 10*3/uL (ref 1.7–7.7)
Neutrophils Relative %: 47 % (ref 43–77)
RBC: 4.76 MIL/uL (ref 3.87–5.11)

## 2012-09-14 LAB — COMPREHENSIVE METABOLIC PANEL
Alkaline Phosphatase: 73 U/L (ref 39–117)
BUN: 7 mg/dL (ref 6–23)
Chloride: 102 mEq/L (ref 96–112)
GFR calc Af Amer: >90 mL/min (ref >90)
Glucose, Bld: 81 mg/dL (ref 70–99)
Potassium: 4.4 mEq/L (ref 3.5–5.1)
Total Bilirubin: 0.2 mg/dL — ABNORMAL LOW (ref 0.3–1.2)

## 2012-09-14 LAB — SURGICAL PCR SCREEN: Staphylococcus aureus: NEGATIVE

## 2012-09-14 MED ORDER — CHLORHEXIDINE GLUCONATE 4 % EX LIQD: 1.0000 "application " | Freq: Once | CUTANEOUS | Status: DC

## 2012-09-14 NOTE — Progress Notes (Deleted)
Called , faxed office dr Sharyn Lull re continue or d/c brilenta for surgery

## 2012-09-14 NOTE — Pre-Procedure Instructions (Addendum)
20 Nina Mondor  09/14/2012   Your procedure is scheduled on:  09/21/12  Report to [facility] at 1000 AM.  Call this number if you have problems the morning of surgery: [phone]   Remember:   Do not eat food or drink:After Midnight.   Take these medicines the morning of surgery with A SIP OF WATER: zyrtec STOP any aspirin, herbal meds, nsaids, blood thinners   Do not wear jewelry, make-up or nail polish.  Do not wear lotions, powders, or perfumes.   Do not shave 48 hours prior to surgery. Men may shave face and neck.  Do not bring valuables to the hospital.  Contacts, dentures or bridgework may not be worn into surgery.  Leave suitcase in the car. After surgery it may be brought to your room.  For patients admitted to the hospital, checkout time is 11:00 AM the day of discharge.             Loreli Slot  [phone]  Patients discharged the day of surg  Special Instructions: Shower using CHG 2 nights before surgery and the night before surgery.  If you shower the day of surgery use CHG.  Use special wash - you have one bottle of CHG for all showers.  You should use approximately 1/3 of the bottle for each shower.   Please read over the following fact sheets that you were given: Pain Booklet, Coughing and Deep Breathing, MRSA Information and Surgical Site Infection Prevention

## 2012-09-20 MED ORDER — DEXTROSE 5 % IV SOLN
3.0000 g | INTRAVENOUS | Status: AC
  Administered 2012-09-21: 3 g via INTRAVENOUS
  Filled 2012-09-20: qty 3000

## 2012-09-20 NOTE — Progress Notes (Signed)
I spoke with Casey Perez and confirmed that she is to arrive at 0900.  Patient said she already had that time change.

## 2012-09-21 ENCOUNTER — Encounter (HOSPITAL_COMMUNITY): Payer: Self-pay | Admitting: *Deleted

## 2012-09-21 ENCOUNTER — Encounter (HOSPITAL_COMMUNITY)
Admission: RE | Disposition: A | Discharge status: Home / Self Care | Payer: Self-pay | Source: Ambulatory Visit | Attending: Surgery

## 2012-09-21 ENCOUNTER — Ambulatory Visit (HOSPITAL_COMMUNITY): Payer: BC Managed Care – PPO | Admitting: *Deleted

## 2012-09-21 ENCOUNTER — Ambulatory Visit (HOSPITAL_COMMUNITY)
Admission: RE | Admit: 2012-09-21 | Discharge: 2012-09-21 | Disposition: A | Discharge status: Home / Self Care | Payer: BC Managed Care – PPO | Source: Ambulatory Visit | Attending: Surgery | Admitting: Surgery

## 2012-09-21 DIAGNOSIS — K649 Unspecified hemorrhoids: Secondary | ICD-10-CM

## 2012-09-21 DIAGNOSIS — K648 Other hemorrhoids: Secondary | ICD-10-CM | POA: Insufficient documentation

## 2012-09-21 DIAGNOSIS — K602 Anal fissure, unspecified: Secondary | ICD-10-CM | POA: Insufficient documentation

## 2012-09-21 HISTORY — PX: HEMORRHOID SURGERY: SHX153

## 2012-09-21 HISTORY — PX: SPHINCTEROTOMY: SHX5279

## 2012-09-21 SURGERY — SPHINCTEROTOMY, ANAL
Anesthesia: General | Site: Rectum | Wound class: Clean Contaminated

## 2012-09-21 MED ORDER — HEMOSTATIC AGENTS (NO CHARGE) OPTIME
TOPICAL | Status: DC | PRN
  Administered 2012-09-21: 1 via TOPICAL

## 2012-09-21 MED ORDER — FLEET ENEMA 7-19 GM/118ML RE ENEM: 1.0000 | ENEMA | Freq: Once | RECTAL | Status: DC

## 2012-09-21 MED ORDER — TRAMADOL HCL 50 MG PO TABS: 50.0000 mg | ORAL_TABLET | Freq: Four times a day (QID) | ORAL | Status: AC | PRN

## 2012-09-21 MED ORDER — LIDOCAINE HCL 2 % EX GEL
CUTANEOUS | Status: DC | PRN
  Administered 2012-09-21: 1

## 2012-09-21 MED ORDER — OXYCODONE HCL 5 MG PO TABS: 5.0000 mg | ORAL_TABLET | Freq: Once | ORAL | Status: DC | PRN

## 2012-09-21 MED ORDER — SODIUM CHLORIDE 0.9 % IJ SOLN
INTRAMUSCULAR | Status: DC | PRN
  Administered 2012-09-21 (×2): 10 mL

## 2012-09-21 MED ORDER — DIPHENHYDRAMINE HCL 50 MG/ML IJ SOLN
6.2500 mg | Freq: Once | INTRAMUSCULAR | Status: AC
  Administered 2012-09-21: 6.25 mg via INTRAVENOUS

## 2012-09-21 MED ORDER — LACTATED RINGERS IV SOLN
INTRAVENOUS | Status: DC | PRN
  Administered 2012-09-21 (×2): via INTRAVENOUS

## 2012-09-21 MED ORDER — BUPIVACAINE-EPINEPHRINE PF 0.25-1:200000 % IJ SOLN
INTRAMUSCULAR | Status: AC
  Filled 2012-09-21: qty 30

## 2012-09-21 MED ORDER — OXYCODONE HCL 5 MG/5ML PO SOLN: 5.0000 mg | Freq: Once | ORAL | Status: DC | PRN

## 2012-09-21 MED ORDER — MEPERIDINE HCL 25 MG/ML IJ SOLN: 6.2500 mg | INTRAMUSCULAR | Status: DC | PRN

## 2012-09-21 MED ORDER — PROMETHAZINE HCL 25 MG/ML IJ SOLN: 6.2500 mg | INTRAMUSCULAR | Status: DC | PRN

## 2012-09-21 MED ORDER — LIDOCAINE HCL 2 % EX GEL: CUTANEOUS | Status: AC | PRN

## 2012-09-21 MED ORDER — PROPOFOL 10 MG/ML IV BOLUS
INTRAVENOUS | Status: DC | PRN
  Administered 2012-09-21: 200 mg via INTRAVENOUS

## 2012-09-21 MED ORDER — BUPIVACAINE LIPOSOME 1.3 % IJ SUSP
20.0000 mL | Freq: Once | INTRAMUSCULAR | Status: AC
  Administered 2012-09-21: 30 mL
  Filled 2012-09-21: qty 20

## 2012-09-21 MED ORDER — FENTANYL CITRATE 0.05 MG/ML IJ SOLN
INTRAMUSCULAR | Status: AC
  Filled 2012-09-21: qty 2

## 2012-09-21 MED ORDER — LIDOCAINE HCL 2 % EX GEL
CUTANEOUS | Status: AC
  Filled 2012-09-21: qty 20

## 2012-09-21 MED ORDER — 0.9 % SODIUM CHLORIDE (POUR BTL) OPTIME
TOPICAL | Status: DC | PRN
  Administered 2012-09-21: 1000 mL

## 2012-09-21 MED ORDER — FENTANYL CITRATE 0.05 MG/ML IJ SOLN
50.0000 ug | Freq: Once | INTRAMUSCULAR | Status: AC
  Administered 2012-09-21: 100 ug via INTRAVENOUS

## 2012-09-21 MED ORDER — MIDAZOLAM HCL 2 MG/2ML IJ SOLN: 0.5000 mg | Freq: Once | INTRAMUSCULAR | Status: DC | PRN

## 2012-09-21 MED ORDER — DIBUCAINE 1 % RE OINT
TOPICAL_OINTMENT | RECTAL | Status: AC
  Filled 2012-09-21: qty 28

## 2012-09-21 MED ORDER — HYDROMORPHONE HCL PF 1 MG/ML IJ SOLN
0.2500 mg | INTRAMUSCULAR | Status: DC | PRN
  Administered 2012-09-21 (×2): 0.5 mg via INTRAVENOUS

## 2012-09-21 MED ORDER — LIDOCAINE HCL (CARDIAC) 20 MG/ML IV SOLN
INTRAVENOUS | Status: DC | PRN
  Administered 2012-09-21: 100 mg via INTRAVENOUS

## 2012-09-21 MED ORDER — MIDAZOLAM HCL 2 MG/2ML IJ SOLN
1.0000 mg | INTRAMUSCULAR | Status: DC | PRN
  Administered 2012-09-21 (×2): 1 mg via INTRAVENOUS

## 2012-09-21 MED ORDER — DIPHENHYDRAMINE HCL 50 MG/ML IJ SOLN
INTRAMUSCULAR | Status: AC
  Filled 2012-09-21: qty 1

## 2012-09-21 MED ORDER — MIDAZOLAM HCL 2 MG/2ML IJ SOLN
INTRAMUSCULAR | Status: AC
  Filled 2012-09-21: qty 2

## 2012-09-21 MED ORDER — POLYETHYLENE GLYCOL 3350 17 GM/SCOOP PO POWD: 17.0000 g | Freq: Every day | ORAL | Status: AC

## 2012-09-21 MED ORDER — HYDROMORPHONE HCL PF 1 MG/ML IJ SOLN
INTRAMUSCULAR | Status: AC
  Filled 2012-09-21: qty 1

## 2012-09-21 MED ORDER — LACTATED RINGERS IV SOLN
INTRAVENOUS | Status: DC
  Administered 2012-09-21: 11:00:00 via INTRAVENOUS

## 2012-09-21 MED ORDER — FENTANYL CITRATE 0.05 MG/ML IJ SOLN
INTRAMUSCULAR | Status: DC | PRN
  Administered 2012-09-21 (×2): 50 ug via INTRAVENOUS
  Administered 2012-09-21: 100 ug via INTRAVENOUS
  Administered 2012-09-21 (×3): 50 ug via INTRAVENOUS

## 2012-09-21 MED ORDER — DEXAMETHASONE SODIUM PHOSPHATE 4 MG/ML IJ SOLN
INTRAMUSCULAR | Status: DC | PRN
  Administered 2012-09-21: 4 mg via INTRAVENOUS

## 2012-09-21 MED ORDER — ONDANSETRON HCL 4 MG/2ML IJ SOLN
INTRAMUSCULAR | Status: DC | PRN
  Administered 2012-09-21: 4 mg via INTRAVENOUS

## 2012-09-21 MED ORDER — OXYCODONE-ACETAMINOPHEN 5-325 MG PO TABS: 1.0000 | ORAL_TABLET | ORAL | Status: AC | PRN

## 2012-09-21 SURGICAL SUPPLY — 48 items
BLADE SURG 11 STRL SS (BLADE) ×1 IMPLANT
BLADE SURG 15 STRL LF DISP TIS (BLADE) ×1 IMPLANT
BLADE SURG 15 STRL SS (BLADE) ×2
CANISTER SUCTION 2500CC (MISCELLANEOUS) ×2 IMPLANT
CLOTH BEACON ORANGE TIMEOUT ST (SAFETY) ×2 IMPLANT
COVER SURGICAL LIGHT HANDLE (MISCELLANEOUS) ×2 IMPLANT
DECANTER SPIKE VIAL GLASS SM (MISCELLANEOUS) ×2 IMPLANT
DRAPE PROXIMA HALF (DRAPES) ×2 IMPLANT
DRAPE UTILITY 15X26 W/TAPE STR (DRAPE) ×4 IMPLANT
DRSG PAD ABDOMINAL 8X10 ST (GAUZE/BANDAGES/DRESSINGS) ×2 IMPLANT
ELECT CAUTERY BLADE 6.4 (BLADE) ×2 IMPLANT
ELECT REM PT RETURN 9FT ADLT (ELECTROSURGICAL) ×2
ELECTRODE REM PT RTRN 9FT ADLT (ELECTROSURGICAL) ×1 IMPLANT
GAUZE SPONGE 4X4 16PLY XRAY LF (GAUZE/BANDAGES/DRESSINGS) ×2 IMPLANT
GLOVE BIO SURGEON STRL SZ7 (GLOVE) ×1 IMPLANT
GLOVE BIO SURGEON STRL SZ8 (GLOVE) ×2 IMPLANT
GLOVE BIOGEL PI IND STRL 7.0 (GLOVE) IMPLANT
GLOVE BIOGEL PI IND STRL 8 (GLOVE) ×1 IMPLANT
GLOVE BIOGEL PI INDICATOR 7.0 (GLOVE) ×2
GLOVE BIOGEL PI INDICATOR 8 (GLOVE) ×1
GLOVE SS BIOGEL STRL SZ 6.5 (GLOVE) IMPLANT
GLOVE SUPERSENSE BIOGEL SZ 6.5 (GLOVE) ×1
GOWN STRL NON-REIN LRG LVL3 (GOWN DISPOSABLE) ×4 IMPLANT
KIT BASIN OR (CUSTOM PROCEDURE TRAY) ×2 IMPLANT
KIT ROOM TURNOVER OR (KITS) ×2 IMPLANT
NDL HYPO 25GX1X1/2 BEV (NEEDLE) ×1 IMPLANT
NEEDLE HYPO 25GX1X1/2 BEV (NEEDLE) ×2 IMPLANT
NS IRRIG 1000ML POUR BTL (IV SOLUTION) ×2 IMPLANT
PACK LITHOTOMY IV (CUSTOM PROCEDURE TRAY) ×2 IMPLANT
PAD ARMBOARD 7.5X6 YLW CONV (MISCELLANEOUS) ×4 IMPLANT
PENCIL BUTTON HOLSTER BLD 10FT (ELECTRODE) ×2 IMPLANT
SHEARS HARMONIC 9CM CVD (BLADE) ×2 IMPLANT
SPECIMEN JAR SMALL (MISCELLANEOUS) ×2 IMPLANT
SPONGE GAUZE 4X4 12PLY (GAUZE/BANDAGES/DRESSINGS) ×2 IMPLANT
SPONGE SURGIFOAM ABS GEL 12-7 (HEMOSTASIS) ×2 IMPLANT
SURGILUBE 2OZ TUBE FLIPTOP (MISCELLANEOUS) ×2 IMPLANT
SUT CHROMIC 2 0 SH (SUTURE) IMPLANT
SUT MON AB 3-0 SH 27 (SUTURE) ×2
SUT MON AB 3-0 SH27 (SUTURE) ×1 IMPLANT
SYR BULB IRRIGATION 50ML (SYRINGE) ×1 IMPLANT
SYR CONTROL 10ML LL (SYRINGE) ×3 IMPLANT
TAPE PAPER 2X10 WHT MICROPORE (GAUZE/BANDAGES/DRESSINGS) ×1 IMPLANT
TOWEL OR 17X24 6PK STRL BLUE (TOWEL DISPOSABLE) ×2 IMPLANT
TOWEL OR 17X26 10 PK STRL BLUE (TOWEL DISPOSABLE) ×2 IMPLANT
TRAY PROCTOSCOPIC FIBER OPTIC (SET/KITS/TRAYS/PACK) IMPLANT
TUBE CONNECTING 12X1/4 (SUCTIONS) ×2 IMPLANT
UNDERPAD 30X30 INCONTINENT (UNDERPADS AND DIAPERS) ×2 IMPLANT
YANKAUER SUCT BULB TIP NO VENT (SUCTIONS) ×2 IMPLANT

## 2012-09-21 NOTE — Anesthesia Preprocedure Evaluation (Addendum)
Anesthesia Evaluation  Patient identified by MRN, date of birth, ID band Patient awake    Reviewed: Allergy & Precautions, H&P , NPO status , Patient's Chart, lab work & pertinent test results, reviewed documented beta blocker date and time   History of Anesthesia Complications Negative for: history of anesthetic complications  Airway Mallampati: I TM Distance: >3 FB Neck ROM: Full    Dental No notable dental hx. (+) Teeth Intact and Dental Advisory Given   Pulmonary former smoker,  breath sounds clear to auscultation  Pulmonary exam normal       Cardiovascular negative cardio ROS  Rhythm:Regular Rate:Normal     Neuro/Psych negative neurological ROS     GI/Hepatic Neg liver ROS, GERD-  Controlled,  Endo/Other  Morbid obesity  Renal/GU negative Renal ROS     Musculoskeletal   Abdominal (+) + obese,   Peds  Hematology negative hematology ROS (+)   Anesthesia Other Findings   Reproductive/Obstetrics                          Anesthesia Physical Anesthesia Plan  ASA: II  Anesthesia Plan: General   Post-op Pain Management:    Induction: Intravenous  Airway Management Planned: Oral ETT and LMA  Additional Equipment:   Intra-op Plan:   Post-operative Plan: Extubation in OR  Informed Consent: I have reviewed the patients History and Physical, chart, labs and discussed the procedure including the risks, benefits and alternatives for the proposed anesthesia with the patient or authorized representative who has indicated his/her understanding and acceptance.   Dental advisory given  Plan Discussed with: CRNA, Surgeon and Anesthesiologist  Anesthesia Plan Comments: (Plan routine monitors, GETA)       Anesthesia Quick Evaluation

## 2012-09-21 NOTE — Progress Notes (Signed)
Dr Luisa Hart omitted the fleets enema.

## 2012-09-21 NOTE — Op Note (Signed)
Preoperative diagnosis: Grade 3 prolapsed internal hemorrhoids 2 columns and non healing anal fissure  Postoperative diagnosis: Same  Procedure: 2 column internal  Hemorrhoidectomy and lateral internal sphincterotomy   Surgeon: Harriette Bouillon M.D.  Anesthesia: LMA with local anesthesia Exparel   EBL: Less than 50 cc  Specimens: Hemorrhoid tissue  Drains: None  IV fluids:800 cc   Indications for procedure: The patient presents with symptomatic hemorrhoid disease and non healing anal fissure.  Marland Kitchen Options of treatment including medical management, office-based procedures and surgical modalities. All have been discussed with the patient. The patient is failed medical management. They wish to proceed with formal hemorrhoid ectomy and lateral internal sphincterotomy . Risks, benefits and all alternative therapies with complications of long term expectations discussed. They agree to proceed with surgery.  Description of procedure: The patient was met in the holding area and questions are answered. The patient was taken back to the operating room and placed upon the operating room table. After induction of LMA anesthesia, the patient was placed lithotomy and appropriate the padded. The anal canal and perineum were prepped and draped in a sterile fashion. Timeout was done and the patient received preoperative antibiotics.  Harmonic scalpel was used after digital examination to excise hemorrhoid tissue in the left lateral and right posterior position.  3 O monocryl used to over sew mucosa. . Internal sphincter was  Thickened and left lateral closed technique used to divide the internal sphincter and preserve the external sphincter . Non healed posterior midline anal fissure noted with exposed sphincter Hemostasis achieved. Irrigation used. No significant narrowing of the anal canal noted. Surgicel wrapped around Gelfoam used packing. Local anesthesia infiltrated for perianal block consisting of Exparel  diluted with 20 cc saline . All final counts are found to be correct. The patient was taken to lithotomy, extubated and transported to recovery in satisfactory condition.

## 2012-09-21 NOTE — Interval H&P Note (Signed)
History and Physical Interval Note:  09/21/2012 10:47 AM  Casey Perez  has presented today for surgery, with the diagnosis of hemorrhoid and anal fissure  The various methods of treatment have been discussed with the patient and family. After consideration of risks, benefits and other options for treatment, the patient has consented to  Procedure(s) (LRB) with comments: SPHINCTEROTOMY (N/A) - LATERAL,INTERNAL SPHINCTEROTOMY HEMORRHOIDECTOMY (N/A) - HEMORRHOIDECTOMY 3 columns as a surgical intervention .  The patient's history has been reviewed, patient examined, no change in status, stable for surgery.  I have reviewed the patient's chart and labs.  Questions were answered to the patient's satisfaction.     Cornelia Walraven A.

## 2012-09-21 NOTE — Transfer of Care (Signed)
Immediate Anesthesia Transfer of Care Note  Patient: Casey Perez  Procedure(s) Performed: Procedure(s) (LRB) with comments: SPHINCTEROTOMY (N/A) - LATERAL,INTERNAL SPHINCTEROTOMY HEMORRHOIDECTOMY (N/A) - HEMORRHOIDECTOMY 3 columns  Patient Location: PACU  Anesthesia Type:General  Level of Consciousness: awake, alert  and oriented  Airway & Oxygen Therapy: Patient Spontanous Breathing and Patient connected to nasal cannula oxygen  Post-op Assessment: Report given to PACU RN and Post -op Vital signs reviewed and stable  Post vital signs: Reviewed and stable  Complications: No apparent anesthesia complications

## 2012-09-21 NOTE — Anesthesia Postprocedure Evaluation (Signed)
  Anesthesia Post-op Note  Patient: Casey Perez  Procedure(s) Performed: Procedure(s) (LRB) with comments: SPHINCTEROTOMY (N/A) - LATERAL,INTERNAL SPHINCTEROTOMY HEMORRHOIDECTOMY (N/A) - HEMORRHOIDECTOMY 3 columns  Patient Location: PACU  Anesthesia Type:General  Level of Consciousness: awake, alert , oriented and patient cooperative  Airway and Oxygen Therapy: Patient Spontanous Breathing  Post-op Pain: mild  Post-op Assessment: Post-op Vital signs reviewed, Patient's Cardiovascular Status Stable, Respiratory Function Stable, Patent Airway, No signs of Nausea or vomiting and Pain level controlled  Post-op Vital Signs: Reviewed and stable  Complications: No apparent anesthesia complications

## 2012-09-21 NOTE — H&P (View-Only) (Signed)
Patient ID: Casey Perez, female   DOB: 11/09/1971, 41 y.o.   MRN: [id number]  No chief complaint on file.   HPI Casey Perez is a 41 y.o. female.   HPI  Patient sent at the request of Dr. Pyrtle due to hemorrhoids and history of anal fissure. She has a long-standing history of rectal itching, burning, pain, bleeding and overall discomfort. This has waxed and waned over the years. She has tried medical management and has had some success with that but the condition is not resolve completely.she returns to discuss surgical options again.  Past Medical History  Diagnosis Date  . Ruptured appendix   . Hemorrhoids   . UTI (lower urinary tract infection)   . Miscarriage     Past Surgical History  Procedure Date  . Appendectomy     Family History  Problem Relation Age of Onset  . Hypertension Father   . Diabetes Father   . Hypertension Mother     Social History History  Substance Use Topics  . Smoking status: Former Smoker    Quit date: 02/28/2011  . Smokeless tobacco: Never Used  . Alcohol Use: 2.0 oz/week    4 drink(s) per week     occasional    Allergies  Allergen Reactions  . Sulfa Antibiotics     Current Outpatient Prescriptions  Medication Sig Dispense Refill  . busPIRone (BUSPAR) 10 MG tablet Take 10 mg by mouth 2 (two) times daily.      . diltiazem 2 % GEL Apply 1 application topically 3 (three) times daily.  30 g  1  . hydrocortisone-pramoxine (ANALPRAM-HC) 2.5-1 % rectal cream Place rectally 3 (three) times daily. As needed  30 g  3  . lidocaine (XYLOCAINE) 5 % ointment Apply topically 3 (three) times daily as needed.  35.44 g  0  . valACYclovir (VALTREX) 500 MG tablet Ad lib.      . VICODIN 5-300 MG TABS Ad lib.        Review of Systems Review of Systems  Constitutional: Negative for fever, chills and unexpected weight change.  HENT: Negative for hearing loss, congestion, sore throat, trouble swallowing and voice change.   Eyes: Negative for visual  disturbance.  Respiratory: Negative for cough and wheezing.   Cardiovascular: Negative for chest pain, palpitations and leg swelling.  Gastrointestinal: Positive for anal bleeding and rectal pain. Negative for nausea, vomiting, abdominal pain, diarrhea, constipation, blood in stool and abdominal distention.  Genitourinary: Negative for hematuria, vaginal bleeding and difficulty urinating.  Musculoskeletal: Negative for arthralgias.  Skin: Negative for rash and wound.  Neurological: Negative for seizures, syncope and headaches.  Hematological: Negative for adenopathy. Does not bruise/bleed easily.  Psychiatric/Behavioral: Negative for confusion.    Blood pressure 138/82, pulse 76, temperature 98.3 F (36.8 C), resp. rate 16, height 5' 3" (1.6 m), weight 185 lb 9.6 oz (84.188 kg).  Physical Exam Physical Exam  Constitutional: She is oriented to person, place, and time. She appears well-developed and well-nourished.  HENT:  Head: Normocephalic and atraumatic.  Eyes: EOM are normal. Pupils are equal, round, and reactive to light.  Neck: Normal range of motion. Neck supple.  Cardiovascular: Normal rate and regular rhythm.   Pulmonary/Chest: Effort normal and breath sounds normal.  Abdominal: Soft. Bowel sounds are normal. She exhibits no distension.  Genitourinary:   grade 3 internal external hemorhoids 3 columns and non healed anal fissure posterior midline  Musculoskeletal: Normal range of motion.  Neurological: She is alert and oriented to   person, place, and time.  Skin: Skin is warm and dry.  Psychiatric: She has a normal mood and affect. Her behavior is normal. Judgment and thought content normal.    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 Continue medical management versus office intervention versus hemorrhoidectomy. She has a nonhealing anal fissure and  this will require lateral internal sphincterotomy since sphincter is exposed. Her hemorrhoids could be dealt with at the same time. She has concerns about office-based treatment.     She would like to proceed with hemorrhoidectomy and lateral internal sphincterotomy. Risks discussed. Alternative therapies discussed. Questions answered.The procedure has been discussed with the patient.  Alternative therapies have been discussed with the patient.  Operative risks include bleeding,  Infection,  Organ injury,  Nerve injury,  Blood vessel injury,  DVT,  Pulmonary embolism,  Death,  And possible reoperation.  Medical management risks include worsening of present situation.  The success of the procedure is 50 -90 % at treating patients symptoms.  The patient understands and agrees to proceed.  Mcclain Shall A. 09/06/2012, 12:09 PM    

## 2012-09-21 NOTE — Preoperative (Signed)
Beta Blockers   Reason not to administer Beta Blockers:Not Applicable 

## 2012-09-22 ENCOUNTER — Encounter (HOSPITAL_COMMUNITY): Payer: Self-pay | Admitting: Surgery

## 2012-10-05 ENCOUNTER — Encounter (INDEPENDENT_AMBULATORY_CARE_PROVIDER_SITE_OTHER): Payer: Self-pay | Admitting: Surgery

## 2012-10-05 ENCOUNTER — Ambulatory Visit (INDEPENDENT_AMBULATORY_CARE_PROVIDER_SITE_OTHER): Payer: BC Managed Care – PPO | Admitting: Surgery

## 2012-10-05 VITALS — BP 130/84 | HR 72 | Temp 97.2°F | Resp 14 | Ht 66.0 in | Wt 188.4 lb

## 2012-10-05 DIAGNOSIS — Z9889 Other specified postprocedural states: Secondary | ICD-10-CM

## 2012-10-05 MED ORDER — VALACYCLOVIR HCL 500 MG PO TABS: 500.0000 mg | ORAL_TABLET | Freq: Every day | ORAL | Status: AC

## 2012-10-05 NOTE — Progress Notes (Signed)
Patient returns after lateral internal sphincterotomy and hemorrhoidectomy. She thinks her pain is better but is still sore from surgery.  Exam: Anal canal he'll well without signs of infection. Tone normal.  Impression: This status post lateral internal sphincterotomy and hemorrhoidectomy for nonhealing anal fissure and hemorrhoids doing well  Plan: I refilled her Valtrex. Return as needed. Increase activity as tolerated. Instructions given.

## 2012-10-05 NOTE — Patient Instructions (Signed)
Return as needed

## 2012-10-25 ENCOUNTER — Telehealth (INDEPENDENT_AMBULATORY_CARE_PROVIDER_SITE_OTHER): Payer: Self-pay

## 2012-10-25 NOTE — Telephone Encounter (Signed)
The patient called regarding her surgery for hemorrhoids and fissure one 11/5.  She was seen for follow up 11/19 and was healing well.  Since then she has been having some swelling, a little bit of weeping and itching.  She has pain at times.  No real problems following bowel movements.  I advised soaking in a tub may help several times a day for the swelling.  I thought since she was doing well then had a change, she should be seen.  Dr Luisa Hart is not in the office this week so I offered her to come in today.  She would be seeing Dr Lindie Spruce in urgent office.  She said she prefers to see Dr Luisa Hart.  He is not in until next week.  I am going to have her talk to [facility] to see if she can wait to see Dr Luisa Hart.

## 2012-11-02 ENCOUNTER — Encounter (INDEPENDENT_AMBULATORY_CARE_PROVIDER_SITE_OTHER): Payer: Self-pay | Admitting: Surgery

## 2012-11-02 ENCOUNTER — Ambulatory Visit (INDEPENDENT_AMBULATORY_CARE_PROVIDER_SITE_OTHER): Payer: BC Managed Care – PPO | Admitting: Surgery

## 2012-11-02 VITALS — BP 126/72 | HR 86 | Temp 98.1°F | Resp 18 | Ht 66.75 in | Wt 191.4 lb

## 2012-11-02 DIAGNOSIS — L299 Pruritus, unspecified: Secondary | ICD-10-CM

## 2012-11-02 MED ORDER — PREDNISONE (PAK) 10 MG PO TABS: 10.0000 mg | ORAL_TABLET | Freq: Every day | ORAL | Status: AC

## 2012-11-02 NOTE — Patient Instructions (Signed)
Prednisone taper.  If not better,  Dermatologist may help.  Can see colorectal surgeon in group as well

## 2012-11-02 NOTE — Progress Notes (Signed)
Patient returns after lateral internal sphincterotomy with complaints of anal swelling and itching. She's not appear to have much pain. This started one week ago.  Exam: Rectal exam normal. No masses or signs of infection or abscess. Anoscopy performed which showed well-healing sphincterotomy site. She has no pain or excessive sphincter hyperactivity. There is a an area on left buttock it looks like contact dermatitis. This is where most of her symptoms are.  Impression: History of lateral internal sphincterotomy with what appears to be contact dermatitis left buttock. I see no  Complicating issue from her surgery. We'll place her on a prednisone taper to see if it helps the itching. May need to see dermatology if this does not help. Also see colorectal specialist for an opinion as well if need be if symptoms don't resolve.

## 2012-12-14 ENCOUNTER — Other Ambulatory Visit: Payer: Self-pay | Admitting: Obstetrics and Gynecology

## 2013-03-13 IMAGING — CR DG CHEST 2V
2 series · 2 of 2 positions shown · non-contrast
Comparison: None.

CLINICAL DATA: Preop

CHEST - 2 VIEW

[view not recorded (1 of 2)]
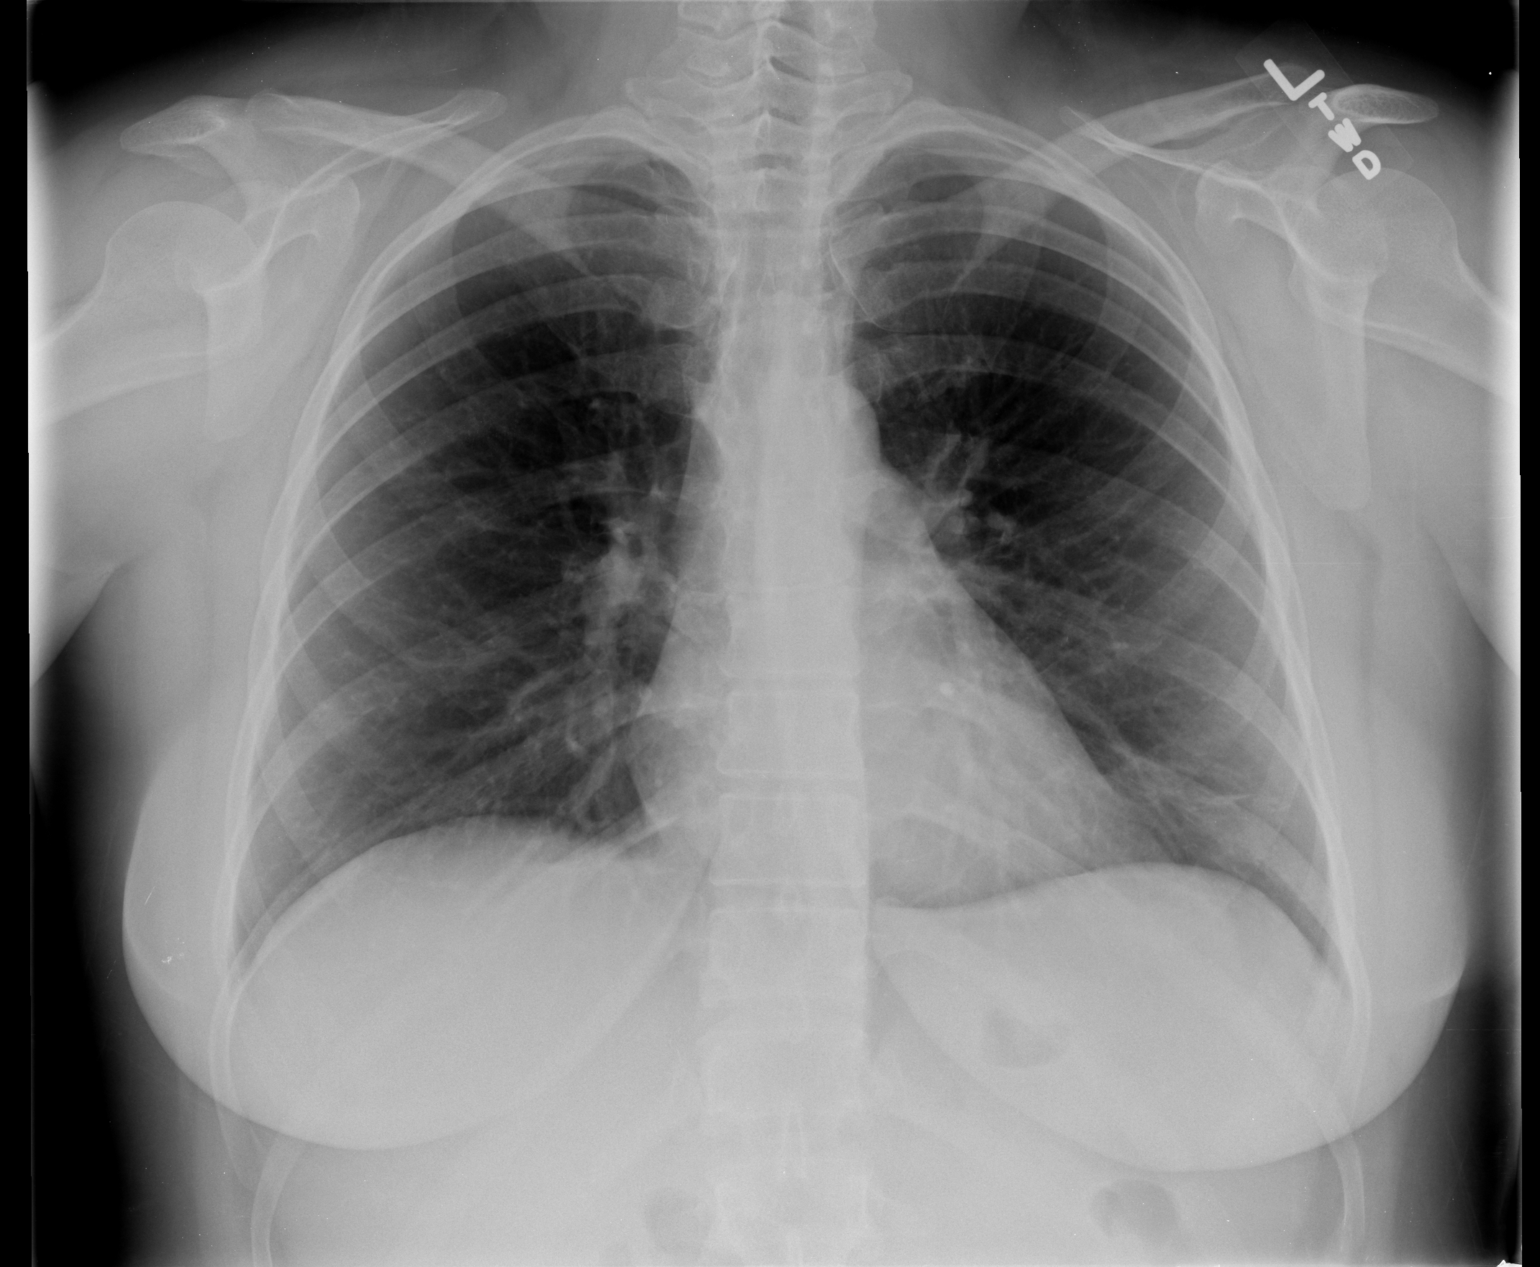

[view not recorded (2 of 2)]
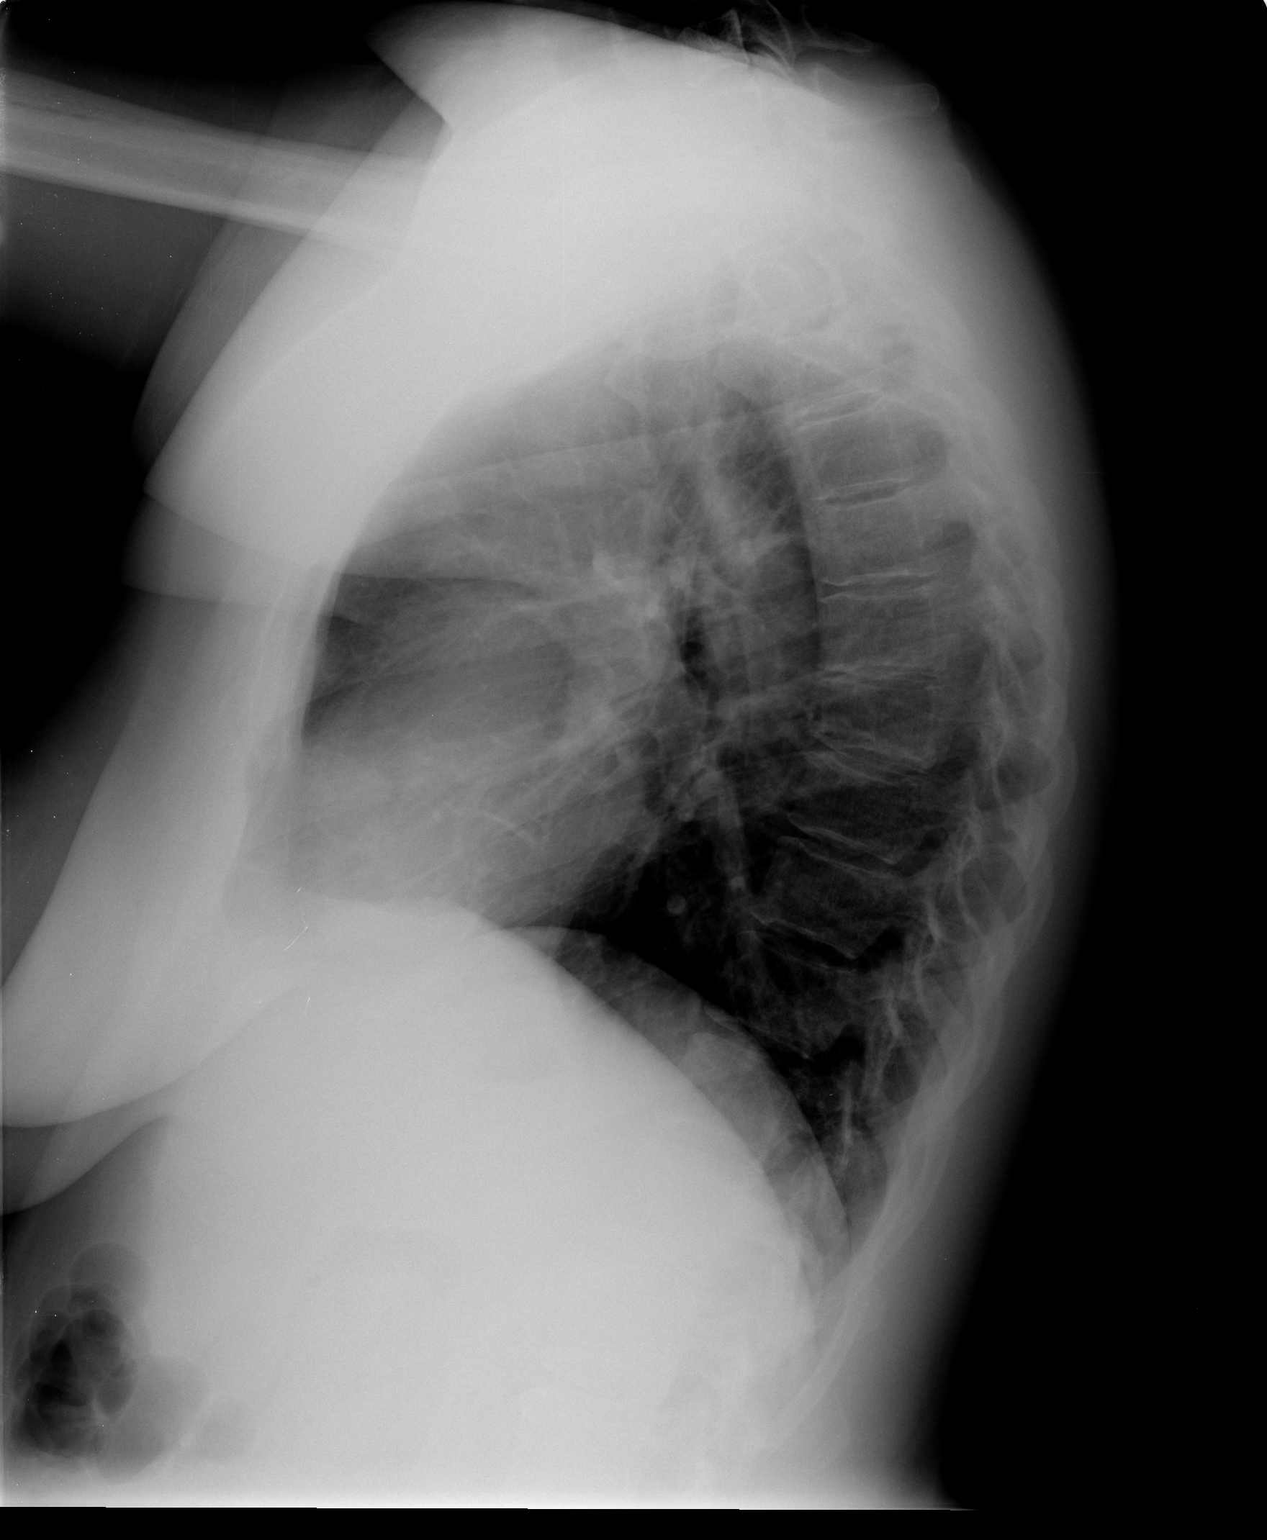

[2 of 2 positions shown; findings below may reference images not displayed]

FINDINGS: Cardiomediastinal silhouette is unremarkable.  No acute
infiltrate or pleural effusion.  No pulmonary edema. Mild
compression deformity mid thoracic spine of indeterminate age.
IMPRESSION: No active disease. Mild compression deformity mid thoracic spine of
indeterminate age.  Clinical correlation is necessary.

## 2016-03-11 DIAGNOSIS — M6249 Contracture of muscle, multiple sites: Secondary | ICD-10-CM | POA: Diagnosis not present

## 2016-03-11 DIAGNOSIS — M9901 Segmental and somatic dysfunction of cervical region: Secondary | ICD-10-CM | POA: Diagnosis not present

## 2016-03-11 DIAGNOSIS — M9902 Segmental and somatic dysfunction of thoracic region: Secondary | ICD-10-CM | POA: Diagnosis not present

## 2016-03-11 DIAGNOSIS — G43101 Migraine with aura, not intractable, with status migrainosus: Secondary | ICD-10-CM | POA: Diagnosis not present

## 2016-04-09 DIAGNOSIS — R05 Cough: Secondary | ICD-10-CM | POA: Diagnosis not present

## 2016-04-09 DIAGNOSIS — J0100 Acute maxillary sinusitis, unspecified: Secondary | ICD-10-CM | POA: Diagnosis not present

## 2016-04-09 DIAGNOSIS — Z6826 Body mass index (BMI) 26.0-26.9, adult: Secondary | ICD-10-CM | POA: Diagnosis not present

## 2016-04-11 DIAGNOSIS — M9901 Segmental and somatic dysfunction of cervical region: Secondary | ICD-10-CM | POA: Diagnosis not present

## 2016-04-11 DIAGNOSIS — M6249 Contracture of muscle, multiple sites: Secondary | ICD-10-CM | POA: Diagnosis not present

## 2016-04-11 DIAGNOSIS — G43101 Migraine with aura, not intractable, with status migrainosus: Secondary | ICD-10-CM | POA: Diagnosis not present

## 2016-04-11 DIAGNOSIS — M9902 Segmental and somatic dysfunction of thoracic region: Secondary | ICD-10-CM | POA: Diagnosis not present

## 2016-08-04 DIAGNOSIS — M9901 Segmental and somatic dysfunction of cervical region: Secondary | ICD-10-CM | POA: Diagnosis not present

## 2016-08-04 DIAGNOSIS — G43101 Migraine with aura, not intractable, with status migrainosus: Secondary | ICD-10-CM | POA: Diagnosis not present

## 2016-08-04 DIAGNOSIS — M6249 Contracture of muscle, multiple sites: Secondary | ICD-10-CM | POA: Diagnosis not present

## 2016-08-04 DIAGNOSIS — M9902 Segmental and somatic dysfunction of thoracic region: Secondary | ICD-10-CM | POA: Diagnosis not present

## 2016-08-06 DIAGNOSIS — M9902 Segmental and somatic dysfunction of thoracic region: Secondary | ICD-10-CM | POA: Diagnosis not present

## 2016-08-06 DIAGNOSIS — M6249 Contracture of muscle, multiple sites: Secondary | ICD-10-CM | POA: Diagnosis not present

## 2016-08-06 DIAGNOSIS — M9901 Segmental and somatic dysfunction of cervical region: Secondary | ICD-10-CM | POA: Diagnosis not present

## 2016-08-06 DIAGNOSIS — G43101 Migraine with aura, not intractable, with status migrainosus: Secondary | ICD-10-CM | POA: Diagnosis not present

## 2016-08-11 DIAGNOSIS — M6249 Contracture of muscle, multiple sites: Secondary | ICD-10-CM | POA: Diagnosis not present

## 2016-08-11 DIAGNOSIS — M9901 Segmental and somatic dysfunction of cervical region: Secondary | ICD-10-CM | POA: Diagnosis not present

## 2016-08-11 DIAGNOSIS — G43101 Migraine with aura, not intractable, with status migrainosus: Secondary | ICD-10-CM | POA: Diagnosis not present

## 2016-08-11 DIAGNOSIS — M9902 Segmental and somatic dysfunction of thoracic region: Secondary | ICD-10-CM | POA: Diagnosis not present

## 2016-08-12 DIAGNOSIS — Z6827 Body mass index (BMI) 27.0-27.9, adult: Secondary | ICD-10-CM | POA: Diagnosis not present

## 2016-08-12 DIAGNOSIS — Z01419 Encounter for gynecological examination (general) (routine) without abnormal findings: Secondary | ICD-10-CM | POA: Diagnosis not present

## 2016-08-15 DIAGNOSIS — M6249 Contracture of muscle, multiple sites: Secondary | ICD-10-CM | POA: Diagnosis not present

## 2016-08-15 DIAGNOSIS — M9901 Segmental and somatic dysfunction of cervical region: Secondary | ICD-10-CM | POA: Diagnosis not present

## 2016-08-15 DIAGNOSIS — G43101 Migraine with aura, not intractable, with status migrainosus: Secondary | ICD-10-CM | POA: Diagnosis not present

## 2016-08-15 DIAGNOSIS — M9902 Segmental and somatic dysfunction of thoracic region: Secondary | ICD-10-CM | POA: Diagnosis not present

## 2016-08-22 DIAGNOSIS — M6249 Contracture of muscle, multiple sites: Secondary | ICD-10-CM | POA: Diagnosis not present

## 2016-08-22 DIAGNOSIS — M9901 Segmental and somatic dysfunction of cervical region: Secondary | ICD-10-CM | POA: Diagnosis not present

## 2016-08-22 DIAGNOSIS — G43101 Migraine with aura, not intractable, with status migrainosus: Secondary | ICD-10-CM | POA: Diagnosis not present

## 2016-08-22 DIAGNOSIS — M9902 Segmental and somatic dysfunction of thoracic region: Secondary | ICD-10-CM | POA: Diagnosis not present

## 2016-08-27 DIAGNOSIS — M9901 Segmental and somatic dysfunction of cervical region: Secondary | ICD-10-CM | POA: Diagnosis not present

## 2016-08-27 DIAGNOSIS — M6249 Contracture of muscle, multiple sites: Secondary | ICD-10-CM | POA: Diagnosis not present

## 2016-08-27 DIAGNOSIS — G43101 Migraine with aura, not intractable, with status migrainosus: Secondary | ICD-10-CM | POA: Diagnosis not present

## 2016-08-27 DIAGNOSIS — M9902 Segmental and somatic dysfunction of thoracic region: Secondary | ICD-10-CM | POA: Diagnosis not present

## 2016-09-10 DIAGNOSIS — M6249 Contracture of muscle, multiple sites: Secondary | ICD-10-CM | POA: Diagnosis not present

## 2016-09-10 DIAGNOSIS — G43101 Migraine with aura, not intractable, with status migrainosus: Secondary | ICD-10-CM | POA: Diagnosis not present

## 2016-09-10 DIAGNOSIS — M9901 Segmental and somatic dysfunction of cervical region: Secondary | ICD-10-CM | POA: Diagnosis not present

## 2016-09-10 DIAGNOSIS — M9902 Segmental and somatic dysfunction of thoracic region: Secondary | ICD-10-CM | POA: Diagnosis not present

## 2016-09-15 DIAGNOSIS — M9901 Segmental and somatic dysfunction of cervical region: Secondary | ICD-10-CM | POA: Diagnosis not present

## 2016-09-15 DIAGNOSIS — G43101 Migraine with aura, not intractable, with status migrainosus: Secondary | ICD-10-CM | POA: Diagnosis not present

## 2016-09-15 DIAGNOSIS — M9902 Segmental and somatic dysfunction of thoracic region: Secondary | ICD-10-CM | POA: Diagnosis not present

## 2016-09-15 DIAGNOSIS — M6249 Contracture of muscle, multiple sites: Secondary | ICD-10-CM | POA: Diagnosis not present

## 2016-09-22 DIAGNOSIS — M9901 Segmental and somatic dysfunction of cervical region: Secondary | ICD-10-CM | POA: Diagnosis not present

## 2016-09-22 DIAGNOSIS — M6249 Contracture of muscle, multiple sites: Secondary | ICD-10-CM | POA: Diagnosis not present

## 2016-09-22 DIAGNOSIS — M9902 Segmental and somatic dysfunction of thoracic region: Secondary | ICD-10-CM | POA: Diagnosis not present

## 2016-09-22 DIAGNOSIS — G43101 Migraine with aura, not intractable, with status migrainosus: Secondary | ICD-10-CM | POA: Diagnosis not present

## 2016-10-13 DIAGNOSIS — M6249 Contracture of muscle, multiple sites: Secondary | ICD-10-CM | POA: Diagnosis not present

## 2016-10-13 DIAGNOSIS — G43101 Migraine with aura, not intractable, with status migrainosus: Secondary | ICD-10-CM | POA: Diagnosis not present

## 2016-10-13 DIAGNOSIS — M9901 Segmental and somatic dysfunction of cervical region: Secondary | ICD-10-CM | POA: Diagnosis not present

## 2016-10-13 DIAGNOSIS — M9902 Segmental and somatic dysfunction of thoracic region: Secondary | ICD-10-CM | POA: Diagnosis not present

## 2017-04-02 DIAGNOSIS — Z6828 Body mass index (BMI) 28.0-28.9, adult: Secondary | ICD-10-CM | POA: Diagnosis not present

## 2017-04-02 DIAGNOSIS — M25512 Pain in left shoulder: Secondary | ICD-10-CM | POA: Diagnosis not present

## 2017-04-02 DIAGNOSIS — R05 Cough: Secondary | ICD-10-CM | POA: Diagnosis not present

## 2017-04-02 DIAGNOSIS — J0100 Acute maxillary sinusitis, unspecified: Secondary | ICD-10-CM | POA: Diagnosis not present

## 2017-04-09 DIAGNOSIS — M7502 Adhesive capsulitis of left shoulder: Secondary | ICD-10-CM | POA: Diagnosis not present

## 2017-05-21 DIAGNOSIS — M7502 Adhesive capsulitis of left shoulder: Secondary | ICD-10-CM | POA: Diagnosis not present

## 2017-08-24 DIAGNOSIS — L821 Other seborrheic keratosis: Secondary | ICD-10-CM | POA: Diagnosis not present

## 2017-08-24 DIAGNOSIS — D225 Melanocytic nevi of trunk: Secondary | ICD-10-CM | POA: Diagnosis not present

## 2017-09-16 DIAGNOSIS — Z13228 Encounter for screening for other metabolic disorders: Secondary | ICD-10-CM | POA: Diagnosis not present

## 2017-09-16 DIAGNOSIS — Z130 Encounter for screening for diseases of the blood and blood-forming organs and certain disorders involving the immune mechanism: Secondary | ICD-10-CM | POA: Diagnosis not present

## 2017-09-16 DIAGNOSIS — Z13220 Encounter for screening for lipoid disorders: Secondary | ICD-10-CM | POA: Diagnosis not present

## 2017-09-16 DIAGNOSIS — R030 Elevated blood-pressure reading, without diagnosis of hypertension: Secondary | ICD-10-CM | POA: Diagnosis not present

## 2017-12-10 DIAGNOSIS — Z6827 Body mass index (BMI) 27.0-27.9, adult: Secondary | ICD-10-CM | POA: Diagnosis not present

## 2017-12-10 DIAGNOSIS — L609 Nail disorder, unspecified: Secondary | ICD-10-CM | POA: Diagnosis not present

## 2019-10-17 ENCOUNTER — Other Ambulatory Visit: Payer: Self-pay | Admitting: Internal Medicine

## 2020-02-12 ENCOUNTER — Ambulatory Visit: Payer: Self-pay | Attending: Internal Medicine

## 2020-02-12 DIAGNOSIS — Z23 Encounter for immunization: Secondary | ICD-10-CM

## 2020-02-12 NOTE — Progress Notes (Signed)
   Covid-19 Vaccination Clinic  Name:  Casey Perez    MRN: [id number] DOB: 08-Mar-1971  02/12/2020  [name] was observed post Covid-19 immunization for 15 minutes without incident. She was provided with Vaccine Information Sheet and instruction to access the V-Safe system.   [name] was instructed to call 911 with any severe reactions post vaccine: Marland Kitchen Difficulty breathing  . Swelling of face and throat  . A fast heartbeat  . A bad rash all over body  . Dizziness and weakness   Immunizations Administered    Name Date Dose VIS Date Route   Pfizer COVID-19 Vaccine 02/12/2020 10:39 AM 0.3 mL 10/28/2019 Intramuscular   Manufacturer: [place]   Lot: [id number]   [place]: [id number]

## 2020-03-04 ENCOUNTER — Ambulatory Visit: Payer: Self-pay | Attending: Internal Medicine

## 2020-03-04 DIAGNOSIS — Z23 Encounter for immunization: Secondary | ICD-10-CM

## 2020-03-04 NOTE — Progress Notes (Signed)
   Covid-19 Vaccination Clinic  Name:  Tayva Marich    MRN: [id number] DOB: 11-16-1971  03/04/2020  Ms. Hennes was observed post Covid-19 immunization for 15 minutes without incident. She was provided with Vaccine Information Sheet and instruction to access the V-Safe system.   Ms. Fullenkamp was instructed to call 911 with any severe reactions post vaccine: Marland Kitchen Difficulty breathing  . Swelling of face and throat  . A fast heartbeat  . A bad rash all over body  . Dizziness and weakness   Immunizations Administered    Name Date Dose VIS Date Route   Pfizer COVID-19 Vaccine 03/04/2020 10:04 AM 0.3 mL 10/28/2019 Intramuscular   Manufacturer: [place]   Lot: [id number]   [place]: [id number]

## 2020-05-16 ENCOUNTER — Other Ambulatory Visit: Payer: Self-pay | Admitting: Internal Medicine

## 2020-05-16 DIAGNOSIS — E042 Nontoxic multinodular goiter: Secondary | ICD-10-CM

## 2020-05-22 ENCOUNTER — Ambulatory Visit
Admission: RE | Admit: 2020-05-22 | Discharge: 2020-05-22 | Disposition: A | Discharge status: Home / Self Care | Payer: BC Managed Care – PPO | Source: Ambulatory Visit | Attending: Internal Medicine | Admitting: Internal Medicine

## 2020-05-22 DIAGNOSIS — E042 Nontoxic multinodular goiter: Secondary | ICD-10-CM

## 2020-05-24 ENCOUNTER — Other Ambulatory Visit: Payer: Self-pay | Admitting: Internal Medicine

## 2020-05-24 DIAGNOSIS — E042 Nontoxic multinodular goiter: Secondary | ICD-10-CM

## 2020-05-28 DIAGNOSIS — E042 Nontoxic multinodular goiter: Secondary | ICD-10-CM | POA: Diagnosis not present

## 2020-05-28 DIAGNOSIS — S30810A Abrasion of lower back and pelvis, initial encounter: Secondary | ICD-10-CM | POA: Diagnosis not present

## 2020-05-29 ENCOUNTER — Other Ambulatory Visit: Payer: Self-pay

## 2020-05-29 ENCOUNTER — Ambulatory Visit
Admission: RE | Admit: 2020-05-29 | Discharge: 2020-05-29 | Disposition: A | Discharge status: Home / Self Care | Payer: BC Managed Care – PPO | Source: Ambulatory Visit | Attending: Internal Medicine | Admitting: Internal Medicine

## 2020-05-29 DIAGNOSIS — E042 Nontoxic multinodular goiter: Secondary | ICD-10-CM

## 2020-05-29 DIAGNOSIS — D34 Benign neoplasm of thyroid gland: Secondary | ICD-10-CM | POA: Diagnosis not present

## 2020-05-29 DIAGNOSIS — E041 Nontoxic single thyroid nodule: Secondary | ICD-10-CM | POA: Insufficient documentation

## 2020-05-29 NOTE — Procedures (Signed)
Ultrasound-guided FNA x 5 via 25-gauge needles of right mid thyroid solid/cystic nodule performed.  No immediate complications.  Specimen submitted for pathology as well as thyroseq.  Medication used-1% lidocaine to skin and subcutaneous tissue. EBL none.

## 2020-06-04 LAB — CYTOLOGY - NON PAP

## 2020-08-07 DIAGNOSIS — Z6828 Body mass index (BMI) 28.0-28.9, adult: Secondary | ICD-10-CM | POA: Diagnosis not present

## 2020-08-07 DIAGNOSIS — Z01419 Encounter for gynecological examination (general) (routine) without abnormal findings: Secondary | ICD-10-CM | POA: Diagnosis not present

## 2020-08-08 DIAGNOSIS — Z01419 Encounter for gynecological examination (general) (routine) without abnormal findings: Secondary | ICD-10-CM | POA: Diagnosis not present

## 2020-08-27 DIAGNOSIS — Z20822 Contact with and (suspected) exposure to covid-19: Secondary | ICD-10-CM | POA: Diagnosis not present

## 2020-08-27 DIAGNOSIS — R519 Headache, unspecified: Secondary | ICD-10-CM | POA: Diagnosis not present

## 2020-08-27 DIAGNOSIS — Z03818 Encounter for observation for suspected exposure to other biological agents ruled out: Secondary | ICD-10-CM | POA: Diagnosis not present

## 2020-10-16 DIAGNOSIS — Z03818 Encounter for observation for suspected exposure to other biological agents ruled out: Secondary | ICD-10-CM | POA: Diagnosis not present

## 2020-10-16 DIAGNOSIS — U071 COVID-19: Secondary | ICD-10-CM | POA: Diagnosis not present

## 2020-10-16 DIAGNOSIS — Z20822 Contact with and (suspected) exposure to covid-19: Secondary | ICD-10-CM | POA: Diagnosis not present

## 2020-10-18 ENCOUNTER — Other Ambulatory Visit: Payer: Self-pay | Admitting: Nurse Practitioner

## 2020-10-18 DIAGNOSIS — U071 COVID-19: Secondary | ICD-10-CM

## 2020-10-18 DIAGNOSIS — R058 Other specified cough: Secondary | ICD-10-CM | POA: Diagnosis not present

## 2020-10-18 NOTE — Progress Notes (Signed)
I connected by phone with Casey Perez on 10/18/2020 at 1:23 PM to discuss the potential use of a new treatment for mild to moderate COVID-19 viral infection in non-hospitalized patients.  This patient is a 49 y.o. female that meets the FDA criteria for Emergency Use Authorization of COVID monoclonal antibody casirivimab/imdevimab, bamlanivimab/eteseviamb, or sotrovimab.  Has a (+) direct SARS-CoV-2 viral test result  Has mild or moderate COVID-19   Is NOT hospitalized due to COVID-19  Is within 10 days of symptom onset  Has at least one of the high risk factor(s) for progression to severe COVID-19 and/or hospitalization as defined in EUA.  Specific high risk criteria : BMI > 25   I have spoken and communicated the following to the patient or parent/caregiver regarding COVID monoclonal antibody treatment:  1. FDA has authorized the emergency use for the treatment of mild to moderate COVID-19 in adults and pediatric patients with positive results of direct SARS-CoV-2 viral testing who are 25 years of age and older weighing at least 40 kg, and who are at high risk for progressing to severe COVID-19 and/or hospitalization.  2. The significant known and potential risks and benefits of COVID monoclonal antibody, and the extent to which such potential risks and benefits are unknown.  3. Information on available alternative treatments and the risks and benefits of those alternatives, including clinical trials.  4. Patients treated with COVID monoclonal antibody should continue to self-isolate and use infection control measures (e.g., wear mask, isolate, social distance, avoid sharing personal items, clean and disinfect "high touch" surfaces, and frequent handwashing) according to CDC guidelines.   5. The patient or parent/caregiver has the option to accept or refuse COVID monoclonal antibody treatment.  After reviewing this information with the patient, the patient has agreed to receive one of  the available covid 19 monoclonal antibodies and will be provided an appropriate fact sheet prior to infusion. Jobe Gibbon, NP 10/18/2020 1:23 PM

## 2020-10-19 ENCOUNTER — Ambulatory Visit (HOSPITAL_COMMUNITY)
Admission: RE | Admit: 2020-10-19 | Discharge: 2020-10-19 | Disposition: A | Discharge status: Home / Self Care | Payer: BC Managed Care – PPO | Source: Ambulatory Visit | Attending: Pulmonary Disease | Admitting: Pulmonary Disease

## 2020-10-19 DIAGNOSIS — U071 COVID-19: Secondary | ICD-10-CM | POA: Diagnosis not present

## 2020-10-19 MED ORDER — SOTROVIMAB 500 MG/8ML IV SOLN
500.0000 mg | Freq: Once | INTRAVENOUS | Status: AC
  Administered 2020-10-19: 500 mg via INTRAVENOUS
  Filled 2020-10-19: qty 8

## 2020-10-19 MED ORDER — SODIUM CHLORIDE 0.9 % IV SOLN: INTRAVENOUS | Status: DC | PRN

## 2020-10-19 MED ORDER — FAMOTIDINE IN NACL 20-0.9 MG/50ML-% IV SOLN: 20.0000 mg | Freq: Once | INTRAVENOUS | Status: DC | PRN

## 2020-10-19 MED ORDER — DIPHENHYDRAMINE HCL 50 MG/ML IJ SOLN: 50.0000 mg | Freq: Once | INTRAMUSCULAR | Status: DC | PRN

## 2020-10-19 MED ORDER — METHYLPREDNISOLONE SODIUM SUCC 125 MG IJ SOLR: 125.0000 mg | Freq: Once | INTRAMUSCULAR | Status: DC | PRN

## 2020-10-19 MED ORDER — EPINEPHRINE 0.3 MG/0.3ML IJ SOAJ: 0.3000 mg | Freq: Once | INTRAMUSCULAR | Status: DC | PRN

## 2020-10-19 MED ORDER — ALBUTEROL SULFATE HFA 108 (90 BASE) MCG/ACT IN AERS: 2.0000 | INHALATION_SPRAY | Freq: Once | RESPIRATORY_TRACT | Status: DC | PRN

## 2020-10-19 NOTE — Progress Notes (Signed)
Patient reviewed Fact Sheet for Patients, Parents, and Caregivers for Emergency Use Authorization (EUA) of Sotrovimab for the Treatment of Coronavirus. Patient also reviewed and is agreeable to the estimated cost of treatment. Patient is agreeable to proceed.   

## 2020-10-19 NOTE — Discharge Instructions (Signed)
10 Things You Can Do to Manage Your COVID-19 Symptoms at Home If you have possible or confirmed COVID-19: 1. Stay home from work and school. And stay away from other public places. If you must go out, avoid using any kind of public transportation, ridesharing, or taxis. 2. Monitor your symptoms carefully. If your symptoms get worse, call your healthcare provider immediately. 3. Get rest and stay hydrated. 4. If you have a medical appointment, call the healthcare provider ahead of time and tell them that you have or may have COVID-19. 5. For medical emergencies, call 911 and notify the dispatch personnel that you have or may have COVID-19. 6. Cover your cough and sneezes with a tissue or use the inside of your elbow. 7. Wash your hands often with soap and water for at least 20 seconds or clean your hands with an alcohol-based hand sanitizer that contains at least 60% alcohol. 8. As much as possible, stay in a specific room and away from other people in your home. Also, you should use a separate bathroom, if available. If you need to be around other people in or outside of the home, wear a mask. 9. Avoid sharing personal items with other people in your household, like dishes, towels, and bedding. 10. Clean all surfaces that are touched often, like counters, tabletops, and doorknobs. Use household cleaning sprays or wipes according to the label instructions. cdc.gov/coronavirus 05/18/2019 This information is not intended to replace advice given to you by your health care provider. Make sure you discuss any questions you have with your health care provider. Document Revised: 10/20/2019 Document Reviewed: 10/20/2019 Elsevier Patient Education  2020 Elsevier Inc. What types of side effects do monoclonal antibody drugs cause?  Common side effects  In general, the more common side effects caused by monoclonal antibody drugs include: . Allergic reactions, such as hives or itching . Flu-like signs and  symptoms, including chills, fatigue, fever, and muscle aches and pains . Nausea, vomiting . Diarrhea . Skin rashes . Low blood pressure   The CDC is recommending patients who receive monoclonal antibody treatments wait at least 90 days before being vaccinated.  Currently, there are no data on the safety and efficacy of mRNA COVID-19 vaccines in persons who received monoclonal antibodies or convalescent plasma as part of COVID-19 treatment. Based on the estimated half-life of such therapies as well as evidence suggesting that reinfection is uncommon in the 90 days after initial infection, vaccination should be deferred for at least 90 days, as a precautionary measure until additional information becomes available, to avoid interference of the antibody treatment with vaccine-induced immune responses. If you have any questions or concerns after the infusion please call the Advanced Practice Provider on call at [phone]. This number is ONLY intended for your use regarding questions or concerns about the infusion post-treatment side-effects.  Please do not provide this number to others for use. For return to work notes please contact your primary care provider.   If someone you know is interested in receiving treatment please have them call the COVID hotline at [phone].   

## 2020-10-25 DIAGNOSIS — R0602 Shortness of breath: Secondary | ICD-10-CM | POA: Diagnosis not present

## 2020-10-25 DIAGNOSIS — J1282 Pneumonia due to coronavirus disease 2019: Secondary | ICD-10-CM | POA: Diagnosis not present

## 2020-10-25 DIAGNOSIS — R079 Chest pain, unspecified: Secondary | ICD-10-CM | POA: Diagnosis not present

## 2020-10-25 DIAGNOSIS — U071 COVID-19: Secondary | ICD-10-CM | POA: Diagnosis not present

## 2020-10-25 DIAGNOSIS — R0789 Other chest pain: Secondary | ICD-10-CM | POA: Diagnosis not present

## 2020-10-26 DIAGNOSIS — R079 Chest pain, unspecified: Secondary | ICD-10-CM | POA: Diagnosis not present

## 2020-11-19 DIAGNOSIS — E042 Nontoxic multinodular goiter: Secondary | ICD-10-CM | POA: Diagnosis not present

## 2020-11-19 DIAGNOSIS — E785 Hyperlipidemia, unspecified: Secondary | ICD-10-CM | POA: Diagnosis not present

## 2020-11-19 DIAGNOSIS — R030 Elevated blood-pressure reading, without diagnosis of hypertension: Secondary | ICD-10-CM | POA: Diagnosis not present

## 2020-11-19 DIAGNOSIS — Z0000 Encounter for general adult medical examination without abnormal findings: Secondary | ICD-10-CM | POA: Diagnosis not present

## 2020-11-25 IMAGING — US US FNA BIOPSY THYROID 1ST LESION
1 series · 12 of 12 positions shown · non-contrast
Comparison: Thyroid ultrasound dated 05/22/2020

INDICATION: Patient with history of multinodular goiter and thyroid ultrasound
on 05/22/2020 which revealed a 5.1 cm solid/cystic right mid thyroid
nodule which meets criteria for biopsy. She presents today for the
procedure.

EXAM:
ULTRASOUND GUIDED FINE NEEDLE ASPIRATION BIOPSY OF RIGHT MID THYROID
NODULE
TECHNIQUE: Informed written consent was obtained from the patient after a
discussion of the risks, benefits and alternatives to treatment.
Questions regarding the procedure were encouraged and answered. A
timeout was performed prior to the initiation of the procedure.

[Series 1: us fna biopsy thyroid 1st lesion · 0.08mm/px · 12 of 12 slices shown]
[im 1/12]
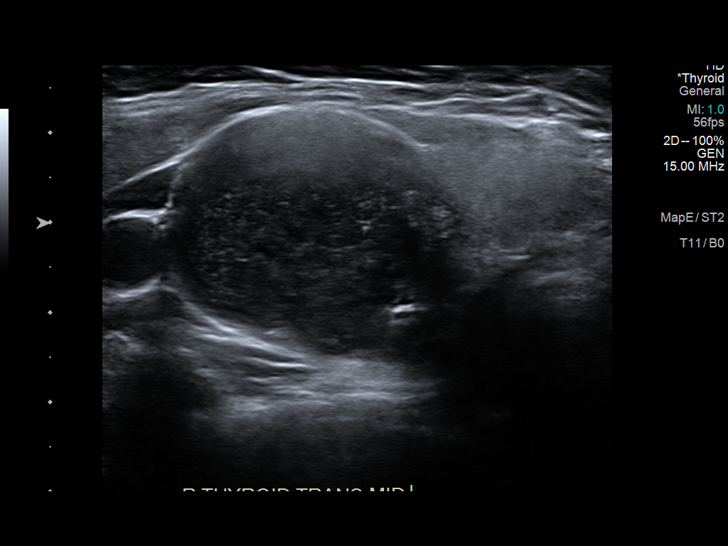
[im 2/12]
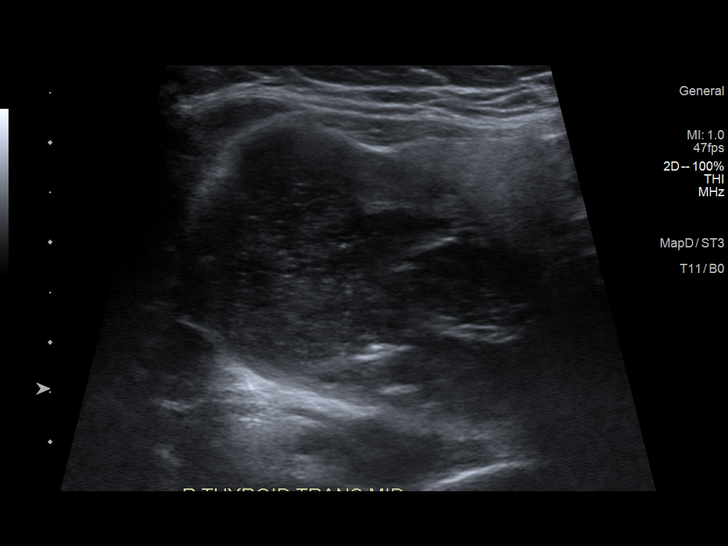
[im 3/12]
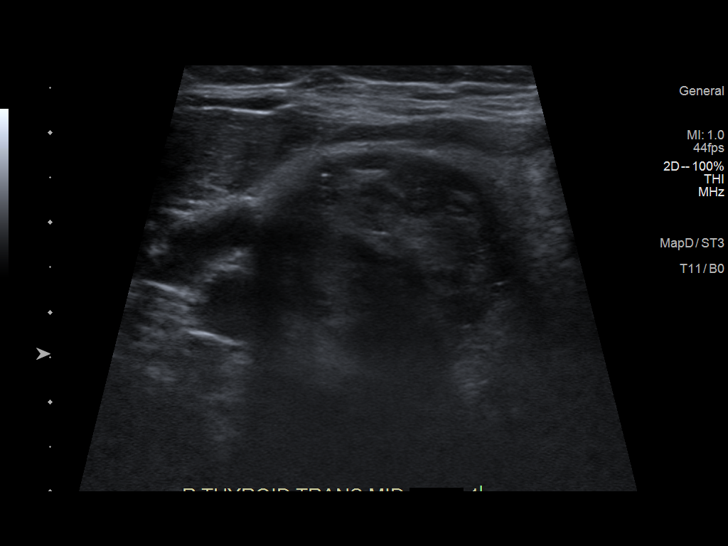
[im 4/12]
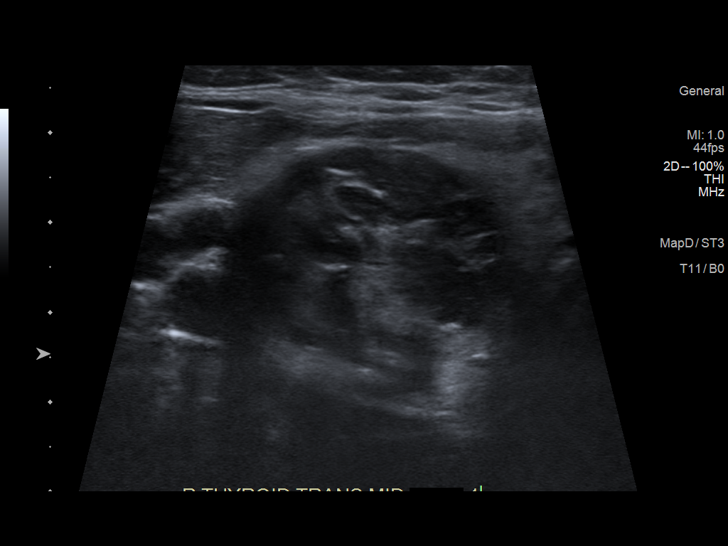
[im 5/12]
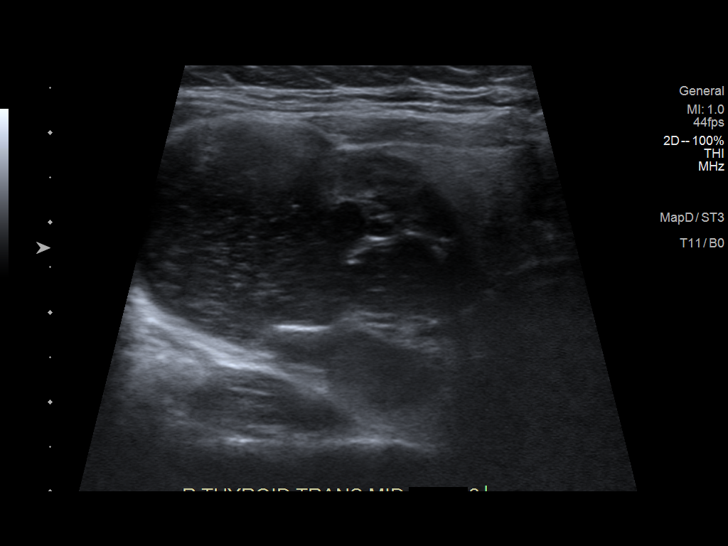
[im 6/12]
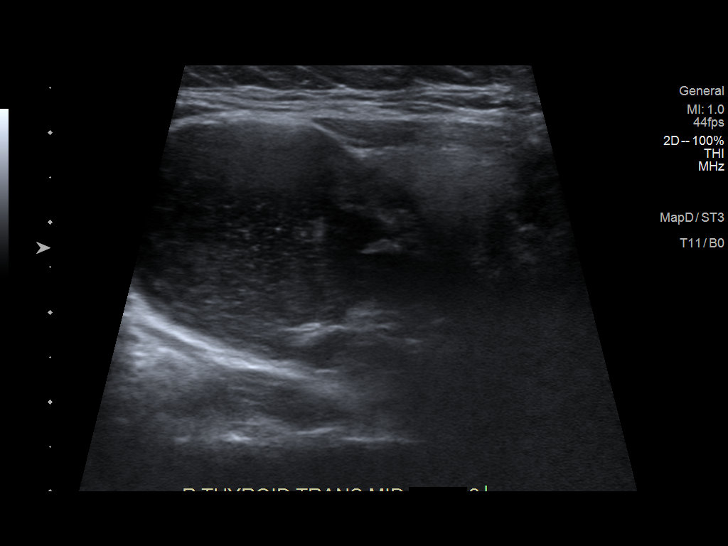
[im 7/12]
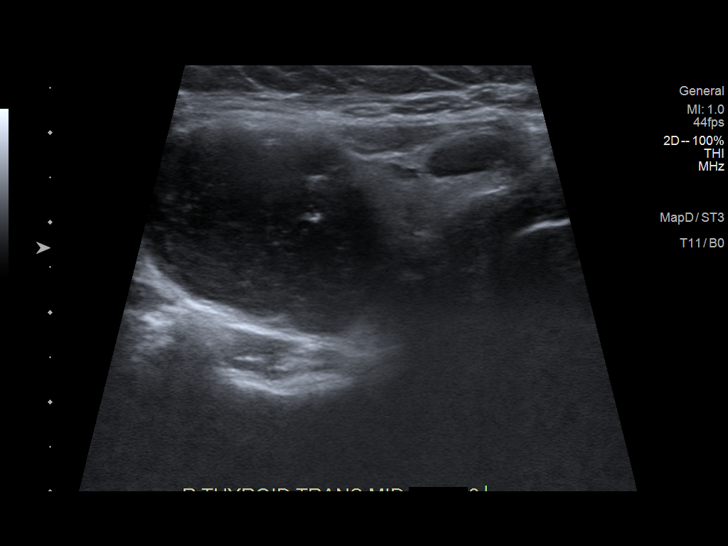
[im 8/12]
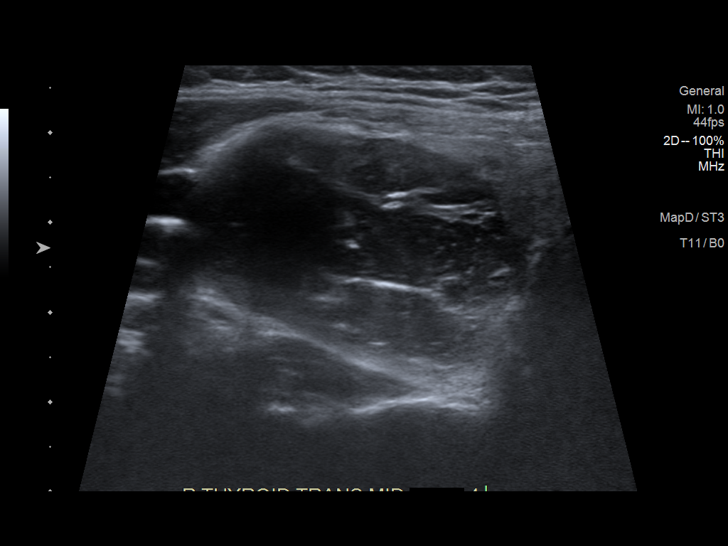
[im 9/12]
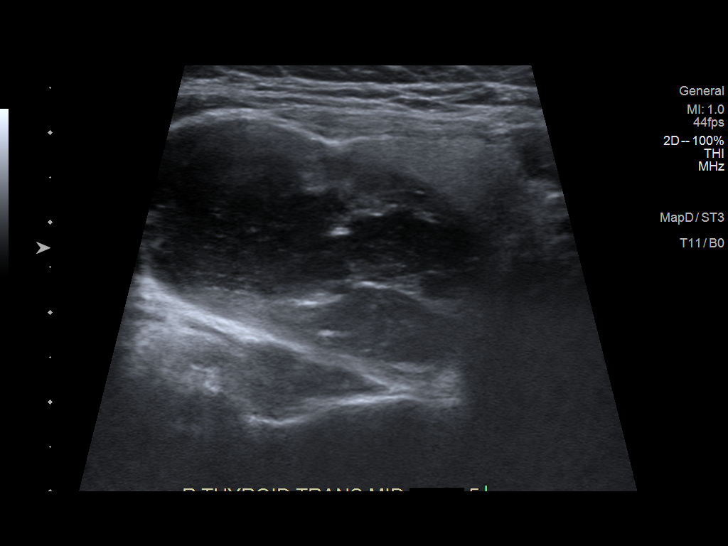
[im 10/12]
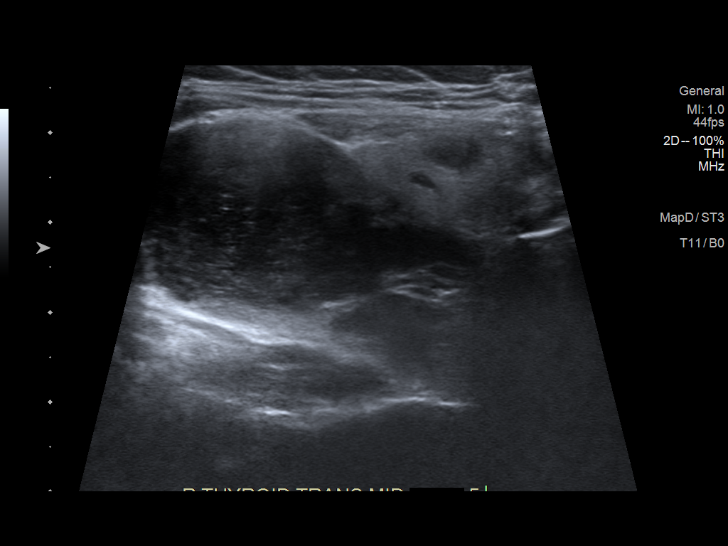
[im 11/12]
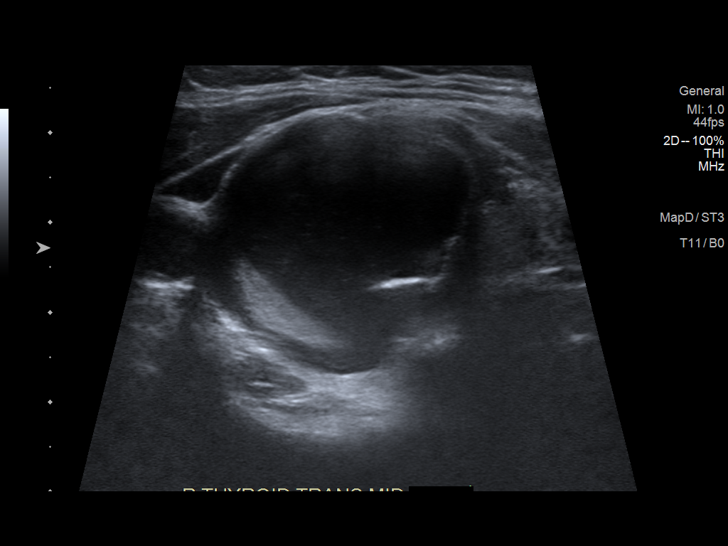
[im 12/12]
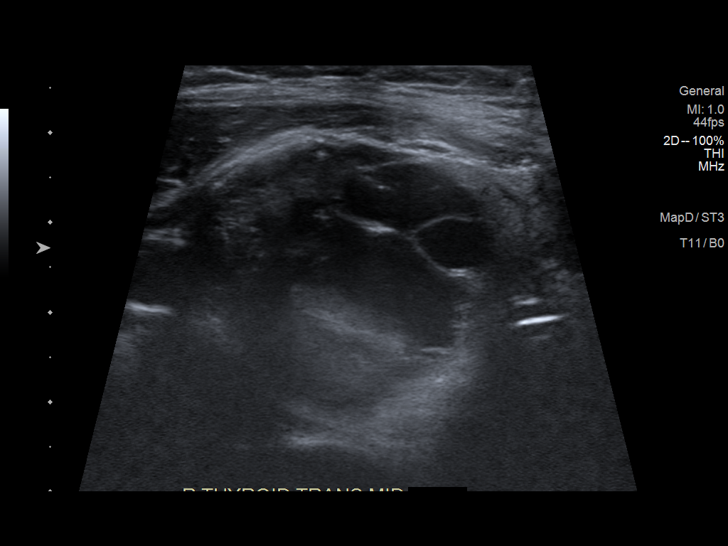

[12 of 12 positions shown; findings below may reference images not displayed]

MEDICATIONS:
1% lidocaine to skin and subcutaneous tissue

COMPLICATIONS:
None immediate.
Pre-procedural ultrasound scanning demonstrated unchanged size and
appearance of the indeterminate nodule within the right mid thyroid
lobe

The procedure was planned. The neck was prepped in the usual sterile
fashion, and a sterile drape was applied covering the operative
field. A timeout was performed prior to the initiation of the
procedure. Local anesthesia was provided with 1% lidocaine.

Under direct ultrasound guidance, 5 FNA biopsies were performed of
the right mid thyroid nodule with 25 gauge needles. Multiple
ultrasound images were saved for procedural documentation purposes.
The samples were prepared and submitted to pathology as well as for
Thyroseq.

Limited post procedural scanning was negative for hematoma or
additional complication. Dressings were placed. The patient
tolerated the above procedures procedure well without immediate
postprocedural complication.
FINDINGS: Nodule reference number based on prior diagnostic ultrasound: 1

Maximum size: 5.1 cm

Location: Right; Mid

ACR TI-RADS risk category: TR4 (4-6 points)

Reason for biopsy: meets ACR TI-RADS criteria

Ultrasound imaging confirms appropriate placement of the needles
within the thyroid nodule.
IMPRESSION: Technically successful ultrasound guided fine needle aspiration
biopsy of right mid thyroid nodule. Final pathology pending.

## 2020-12-10 ENCOUNTER — Other Ambulatory Visit: Payer: Self-pay | Admitting: Internal Medicine

## 2020-12-10 DIAGNOSIS — Z1231 Encounter for screening mammogram for malignant neoplasm of breast: Secondary | ICD-10-CM

## 2020-12-25 DIAGNOSIS — D2261 Melanocytic nevi of right upper limb, including shoulder: Secondary | ICD-10-CM | POA: Diagnosis not present

## 2020-12-25 DIAGNOSIS — D225 Melanocytic nevi of trunk: Secondary | ICD-10-CM | POA: Diagnosis not present

## 2020-12-25 DIAGNOSIS — D2222 Melanocytic nevi of left ear and external auricular canal: Secondary | ICD-10-CM | POA: Diagnosis not present

## 2020-12-25 DIAGNOSIS — D2262 Melanocytic nevi of left upper limb, including shoulder: Secondary | ICD-10-CM | POA: Diagnosis not present

## 2020-12-27 DIAGNOSIS — I10 Essential (primary) hypertension: Secondary | ICD-10-CM | POA: Diagnosis not present

## 2020-12-28 ENCOUNTER — Other Ambulatory Visit: Payer: Self-pay | Admitting: Registered Nurse

## 2020-12-28 DIAGNOSIS — F17200 Nicotine dependence, unspecified, uncomplicated: Secondary | ICD-10-CM

## 2020-12-28 DIAGNOSIS — E785 Hyperlipidemia, unspecified: Secondary | ICD-10-CM

## 2020-12-28 DIAGNOSIS — I10 Essential (primary) hypertension: Secondary | ICD-10-CM

## 2021-01-31 DIAGNOSIS — E785 Hyperlipidemia, unspecified: Secondary | ICD-10-CM | POA: Diagnosis not present

## 2021-01-31 DIAGNOSIS — I10 Essential (primary) hypertension: Secondary | ICD-10-CM | POA: Diagnosis not present

## 2021-05-22 DIAGNOSIS — F3289 Other specified depressive episodes: Secondary | ICD-10-CM | POA: Diagnosis not present

## 2021-05-22 DIAGNOSIS — I10 Essential (primary) hypertension: Secondary | ICD-10-CM | POA: Diagnosis not present

## 2021-08-28 DIAGNOSIS — N390 Urinary tract infection, site not specified: Secondary | ICD-10-CM | POA: Diagnosis not present

## 2021-08-28 DIAGNOSIS — R109 Unspecified abdominal pain: Secondary | ICD-10-CM | POA: Diagnosis not present

## 2021-09-18 DIAGNOSIS — R109 Unspecified abdominal pain: Secondary | ICD-10-CM | POA: Diagnosis not present

## 2021-09-19 ENCOUNTER — Other Ambulatory Visit: Payer: Self-pay

## 2021-09-19 ENCOUNTER — Other Ambulatory Visit: Payer: Self-pay | Admitting: Internal Medicine

## 2021-09-19 ENCOUNTER — Ambulatory Visit
Admission: RE | Admit: 2021-09-19 | Discharge: 2021-09-19 | Disposition: A | Discharge status: Home / Self Care | Payer: BC Managed Care – PPO | Source: Ambulatory Visit | Attending: Internal Medicine | Admitting: Internal Medicine

## 2021-09-19 DIAGNOSIS — R319 Hematuria, unspecified: Secondary | ICD-10-CM

## 2021-09-19 DIAGNOSIS — R109 Unspecified abdominal pain: Secondary | ICD-10-CM | POA: Diagnosis not present

## 2021-09-19 DIAGNOSIS — K8020 Calculus of gallbladder without cholecystitis without obstruction: Secondary | ICD-10-CM | POA: Diagnosis not present

## 2021-09-19 DIAGNOSIS — K5730 Diverticulosis of large intestine without perforation or abscess without bleeding: Secondary | ICD-10-CM | POA: Diagnosis not present

## 2021-09-19 DIAGNOSIS — R10A0 Flank pain, unspecified side: Secondary | ICD-10-CM

## 2021-09-19 DIAGNOSIS — K760 Fatty (change of) liver, not elsewhere classified: Secondary | ICD-10-CM | POA: Diagnosis not present

## 2021-10-01 DIAGNOSIS — Z03818 Encounter for observation for suspected exposure to other biological agents ruled out: Secondary | ICD-10-CM | POA: Diagnosis not present

## 2021-10-01 DIAGNOSIS — J069 Acute upper respiratory infection, unspecified: Secondary | ICD-10-CM | POA: Diagnosis not present

## 2021-10-01 DIAGNOSIS — R6884 Jaw pain: Secondary | ICD-10-CM | POA: Diagnosis not present

## 2021-10-01 DIAGNOSIS — R051 Acute cough: Secondary | ICD-10-CM | POA: Diagnosis not present

## 2021-10-04 DIAGNOSIS — R311 Benign essential microscopic hematuria: Secondary | ICD-10-CM | POA: Diagnosis not present

## 2021-10-04 DIAGNOSIS — N130 Hydronephrosis with ureteropelvic junction obstruction: Secondary | ICD-10-CM | POA: Diagnosis not present

## 2021-10-04 DIAGNOSIS — N281 Cyst of kidney, acquired: Secondary | ICD-10-CM | POA: Diagnosis not present

## 2021-10-04 DIAGNOSIS — Z8051 Family history of malignant neoplasm of kidney: Secondary | ICD-10-CM | POA: Diagnosis not present

## 2021-10-18 DIAGNOSIS — R311 Benign essential microscopic hematuria: Secondary | ICD-10-CM | POA: Diagnosis not present

## 2021-10-21 DIAGNOSIS — Z01419 Encounter for gynecological examination (general) (routine) without abnormal findings: Secondary | ICD-10-CM | POA: Diagnosis not present

## 2021-10-21 DIAGNOSIS — N952 Postmenopausal atrophic vaginitis: Secondary | ICD-10-CM | POA: Diagnosis not present

## 2021-10-21 DIAGNOSIS — R1032 Left lower quadrant pain: Secondary | ICD-10-CM | POA: Diagnosis not present

## 2021-10-21 DIAGNOSIS — Z6829 Body mass index (BMI) 29.0-29.9, adult: Secondary | ICD-10-CM | POA: Diagnosis not present

## 2021-10-29 DIAGNOSIS — H5213 Myopia, bilateral: Secondary | ICD-10-CM | POA: Diagnosis not present

## 2021-11-15 DIAGNOSIS — R311 Benign essential microscopic hematuria: Secondary | ICD-10-CM | POA: Diagnosis not present

## 2022-01-06 DIAGNOSIS — E785 Hyperlipidemia, unspecified: Secondary | ICD-10-CM | POA: Diagnosis not present

## 2022-01-06 DIAGNOSIS — E042 Nontoxic multinodular goiter: Secondary | ICD-10-CM | POA: Diagnosis not present

## 2022-01-14 DIAGNOSIS — Z1231 Encounter for screening mammogram for malignant neoplasm of breast: Secondary | ICD-10-CM | POA: Diagnosis not present

## 2022-01-27 DIAGNOSIS — L821 Other seborrheic keratosis: Secondary | ICD-10-CM | POA: Diagnosis not present

## 2022-01-27 DIAGNOSIS — D0359 Melanoma in situ of other part of trunk: Secondary | ICD-10-CM | POA: Diagnosis not present

## 2022-01-27 DIAGNOSIS — D2362 Other benign neoplasm of skin of left upper limb, including shoulder: Secondary | ICD-10-CM | POA: Diagnosis not present

## 2022-01-27 DIAGNOSIS — D2239 Melanocytic nevi of other parts of face: Secondary | ICD-10-CM | POA: Diagnosis not present

## 2022-01-27 DIAGNOSIS — D2372 Other benign neoplasm of skin of left lower limb, including hip: Secondary | ICD-10-CM | POA: Diagnosis not present

## 2022-01-27 DIAGNOSIS — D0372 Melanoma in situ of left lower limb, including hip: Secondary | ICD-10-CM | POA: Diagnosis not present

## 2022-02-13 DIAGNOSIS — L988 Other specified disorders of the skin and subcutaneous tissue: Secondary | ICD-10-CM | POA: Diagnosis not present

## 2022-02-13 DIAGNOSIS — D0359 Melanoma in situ of other part of trunk: Secondary | ICD-10-CM | POA: Diagnosis not present

## 2022-02-24 DIAGNOSIS — Z1331 Encounter for screening for depression: Secondary | ICD-10-CM | POA: Diagnosis not present

## 2022-02-24 DIAGNOSIS — Z0000 Encounter for general adult medical examination without abnormal findings: Secondary | ICD-10-CM | POA: Diagnosis not present

## 2022-02-24 DIAGNOSIS — I10 Essential (primary) hypertension: Secondary | ICD-10-CM | POA: Diagnosis not present

## 2022-03-18 IMAGING — CT CT ABD-PELV W/O CM
1 of 2 series · 14 of 32 positions shown, 19 images · non-contrast
Comparison: None.

CLINICAL DATA: Left flank pain with hematuria x' 2 weeks. Being
treated with antibiotics for UTI, but still having flank pain and
pain in both hips.

EXAM:
CT ABDOMEN AND PELVIS WITHOUT CONTRAST
TECHNIQUE: Multidetector CT imaging of the abdomen and pelvis was performed
following the standard protocol without IV contrast.

[Series 2: abd/pelvis w/(date) · axial · 0.80mm/px · z∈[-488,-53]mm · 14 of 99 slices shown, 19 images]
[im 6/99  soft-tissue]
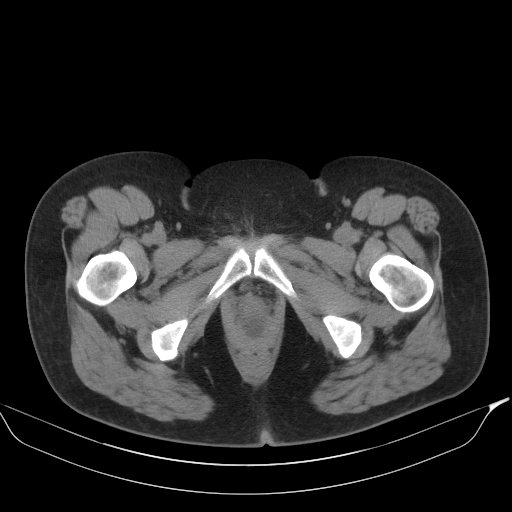
[im 6/99  bone]
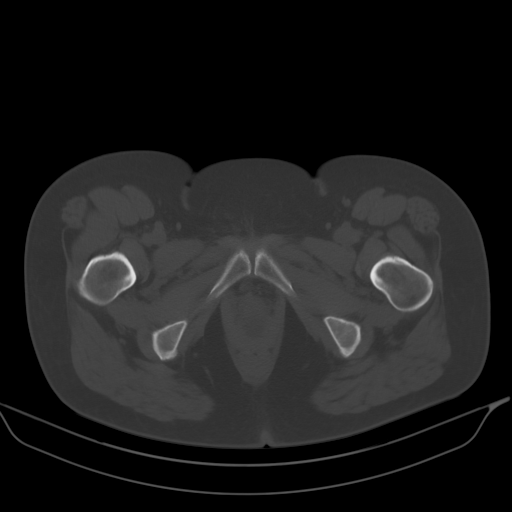
[im 11/99  soft-tissue]
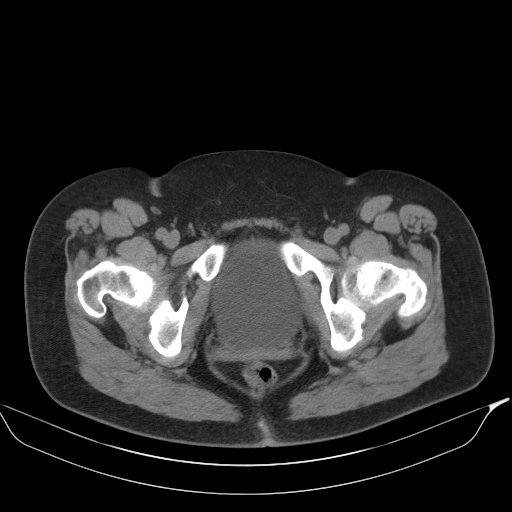
[im 22/99  soft-tissue]
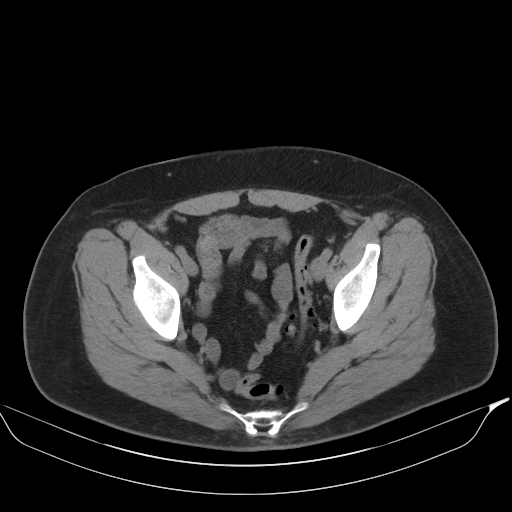
[im 28/99  soft-tissue]
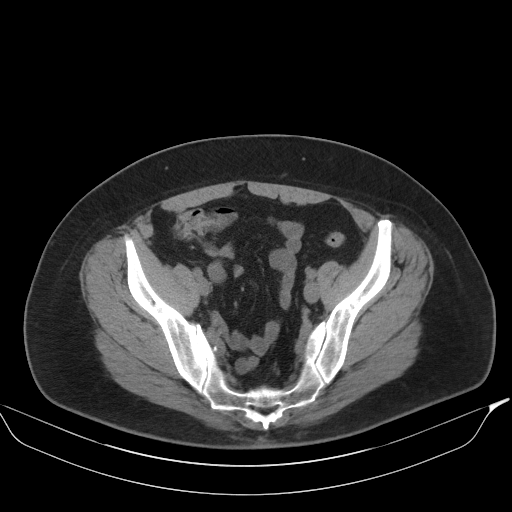
[im 33/99  soft-tissue]
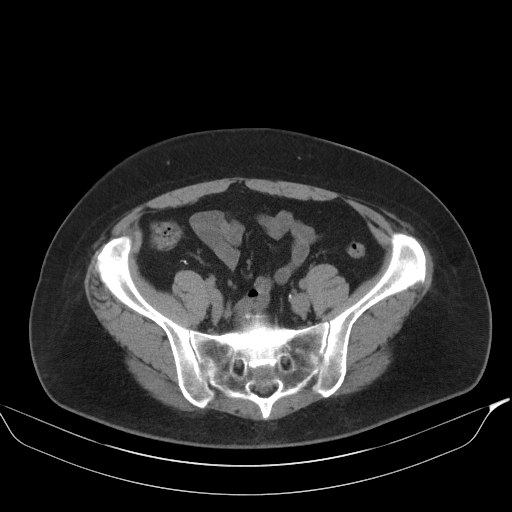
[im 44/99  soft-tissue]
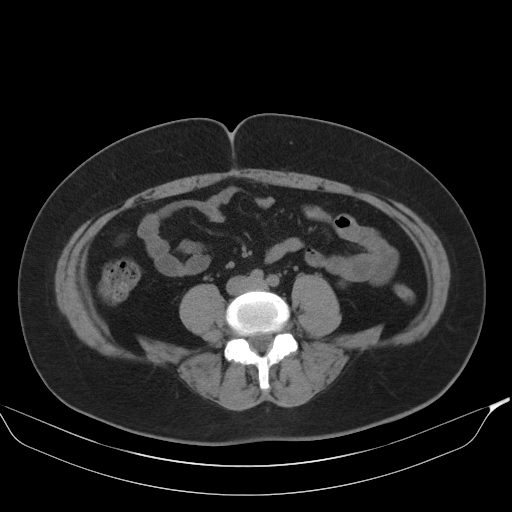
[im 50/99  soft-tissue]
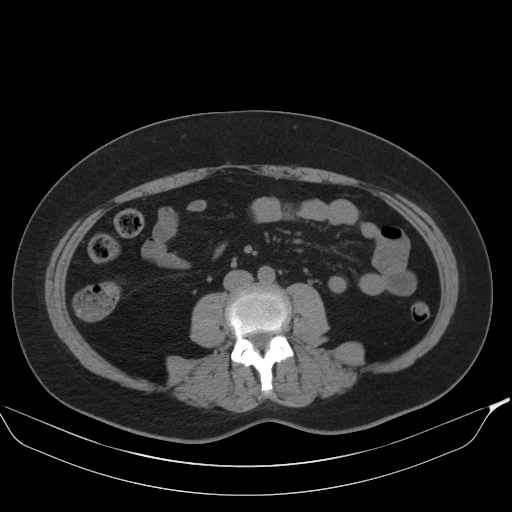
[im 55/99  soft-tissue]
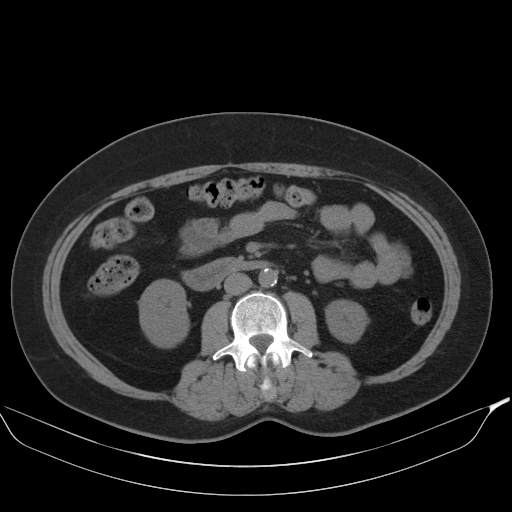
[im 66/99  soft-tissue]
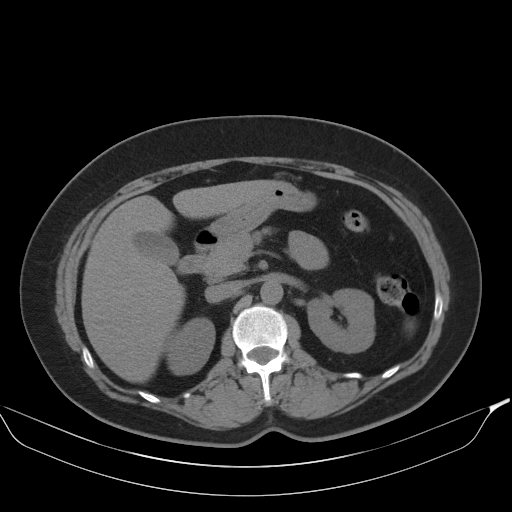
[im 66/99  bone]
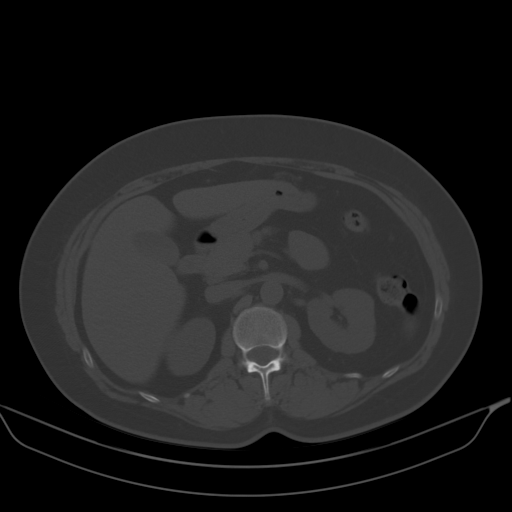
[im 71/99  soft-tissue]
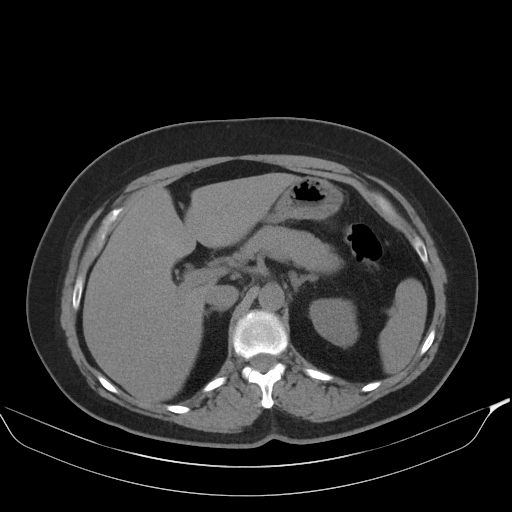
[im 77/99  soft-tissue]
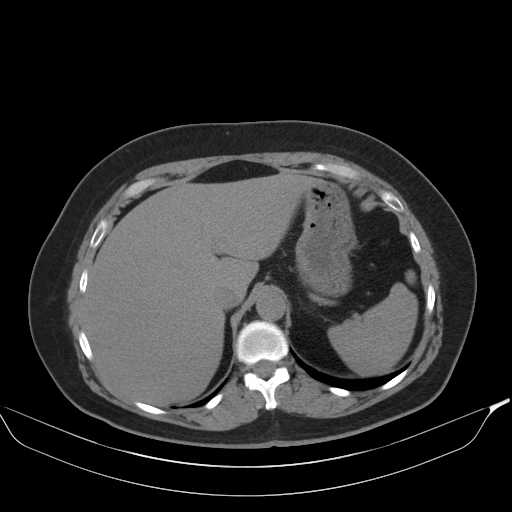
[im 77/99  lung]
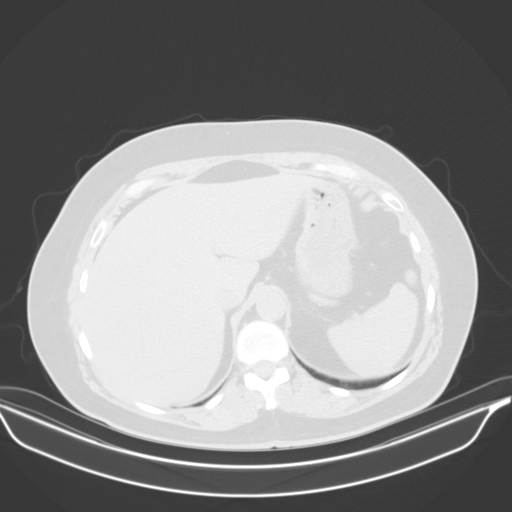
[im 82/99  lung]
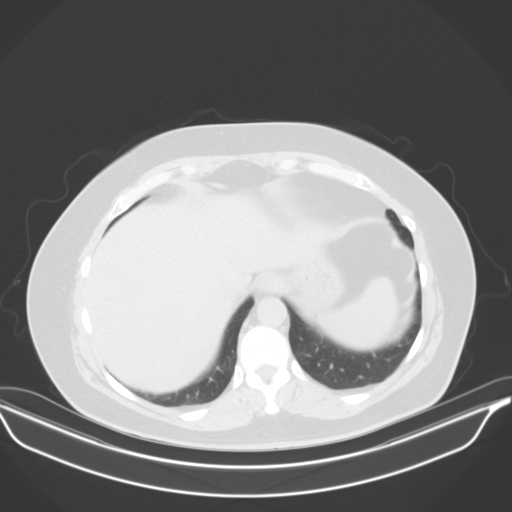
[im 88/99  soft-tissue]
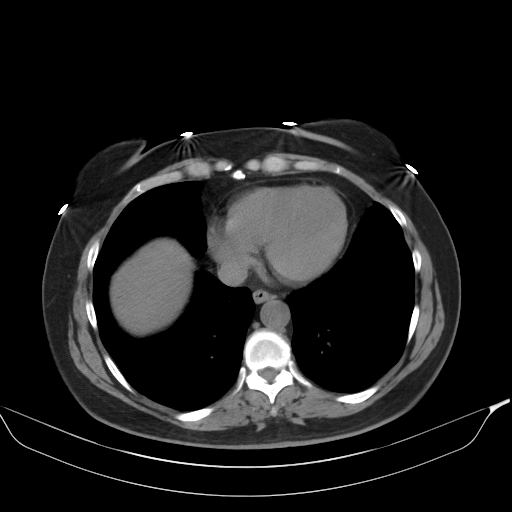
[im 88/99  lung]
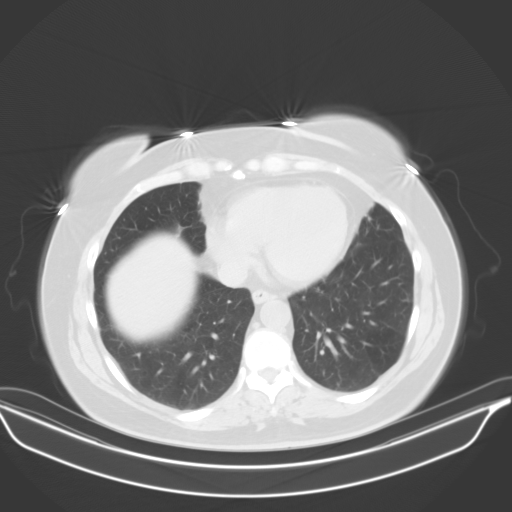
[im 93/99  soft-tissue]
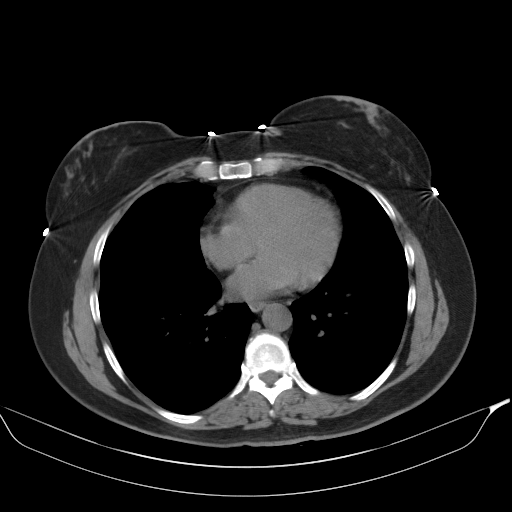
[im 93/99  lung]
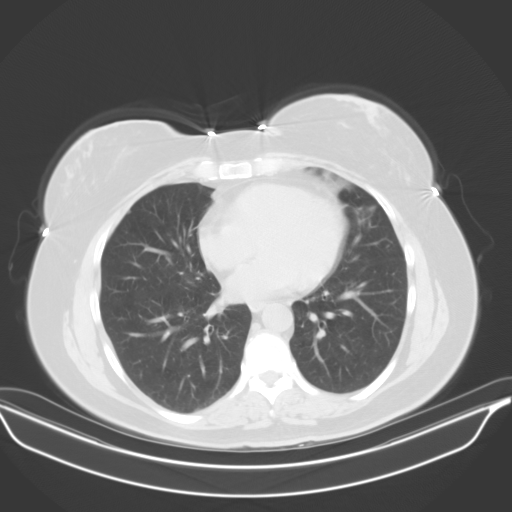

[14 of 32 positions shown; findings below may reference images not displayed]

FINDINGS: Lower chest: No acute abnormality.

Evaluation of the abdominal viscera is limited by the lack of IV
contrast.

Hepatobiliary: Hepatic steatosis. No focal liver lesion. Small
gallstone. No gallbladder wall thickening or pericholecystic fluid.

Pancreas: Unremarkable. No surrounding inflammatory changes.

Spleen: Normal in size without focal abnormality.

Adrenals/Urinary Tract: Adrenal glands are unremarkable. Kidneys are
symmetric in size. There is very mild fullness of the right renal
collecting system. No left hydronephrosis. No renal calculi
identified. Small cyst superior pole left kidney. Urinary bladder is
unremarkable.

Stomach/Bowel: Stomach is within normal limits. Appendix is
surgically absent. No evidence of bowel wall thickening, distention,
or inflammatory changes. Multiple colonic diverticula without
evidence of diverticulitis.

Vascular/Lymphatic: Aortic atherosclerosis. Vascular patency cannot
be assessed in the absence of IV contrast. No enlarged abdominal or
pelvic lymph nodes.

Reproductive: No adnexal masses.

Other: No abdominal wall hernia or abnormality. No abdominopelvic
ascites.

Musculoskeletal: No acute or significant osseous findings.
IMPRESSION: 1. There is nonspecific very mild fullness of the right renal
collecting system. No renal calculi identified.
2. Hepatic steatosis.
3. Cholelithiasis without evidence of acute cholecystitis.
4. Colonic diverticulosis without evidence of diverticulitis.
5. Aortic atherosclerosis.

Aortic Atherosclerosis ([id number]).

## 2022-05-12 DIAGNOSIS — D2222 Melanocytic nevi of left ear and external auricular canal: Secondary | ICD-10-CM | POA: Diagnosis not present

## 2022-05-12 DIAGNOSIS — L814 Other melanin hyperpigmentation: Secondary | ICD-10-CM | POA: Diagnosis not present

## 2022-05-12 DIAGNOSIS — D2262 Melanocytic nevi of left upper limb, including shoulder: Secondary | ICD-10-CM | POA: Diagnosis not present

## 2022-05-12 DIAGNOSIS — L918 Other hypertrophic disorders of the skin: Secondary | ICD-10-CM | POA: Diagnosis not present

## 2022-08-11 DIAGNOSIS — M5417 Radiculopathy, lumbosacral region: Secondary | ICD-10-CM | POA: Diagnosis not present

## 2022-08-11 DIAGNOSIS — M9905 Segmental and somatic dysfunction of pelvic region: Secondary | ICD-10-CM | POA: Diagnosis not present

## 2022-08-11 DIAGNOSIS — M5431 Sciatica, right side: Secondary | ICD-10-CM | POA: Diagnosis not present

## 2022-08-11 DIAGNOSIS — M9903 Segmental and somatic dysfunction of lumbar region: Secondary | ICD-10-CM | POA: Diagnosis not present

## 2022-08-13 DIAGNOSIS — M9903 Segmental and somatic dysfunction of lumbar region: Secondary | ICD-10-CM | POA: Diagnosis not present

## 2022-08-13 DIAGNOSIS — M5417 Radiculopathy, lumbosacral region: Secondary | ICD-10-CM | POA: Diagnosis not present

## 2022-08-13 DIAGNOSIS — M9905 Segmental and somatic dysfunction of pelvic region: Secondary | ICD-10-CM | POA: Diagnosis not present

## 2022-08-13 DIAGNOSIS — M5431 Sciatica, right side: Secondary | ICD-10-CM | POA: Diagnosis not present

## 2022-08-14 DIAGNOSIS — M5431 Sciatica, right side: Secondary | ICD-10-CM | POA: Diagnosis not present

## 2022-08-14 DIAGNOSIS — M5417 Radiculopathy, lumbosacral region: Secondary | ICD-10-CM | POA: Diagnosis not present

## 2022-08-14 DIAGNOSIS — M9905 Segmental and somatic dysfunction of pelvic region: Secondary | ICD-10-CM | POA: Diagnosis not present

## 2022-08-14 DIAGNOSIS — M9903 Segmental and somatic dysfunction of lumbar region: Secondary | ICD-10-CM | POA: Diagnosis not present

## 2022-08-18 DIAGNOSIS — M5417 Radiculopathy, lumbosacral region: Secondary | ICD-10-CM | POA: Diagnosis not present

## 2022-08-18 DIAGNOSIS — M9903 Segmental and somatic dysfunction of lumbar region: Secondary | ICD-10-CM | POA: Diagnosis not present

## 2022-08-18 DIAGNOSIS — M9905 Segmental and somatic dysfunction of pelvic region: Secondary | ICD-10-CM | POA: Diagnosis not present

## 2022-08-18 DIAGNOSIS — M5431 Sciatica, right side: Secondary | ICD-10-CM | POA: Diagnosis not present

## 2022-08-20 DIAGNOSIS — M9905 Segmental and somatic dysfunction of pelvic region: Secondary | ICD-10-CM | POA: Diagnosis not present

## 2022-08-20 DIAGNOSIS — M5417 Radiculopathy, lumbosacral region: Secondary | ICD-10-CM | POA: Diagnosis not present

## 2022-08-20 DIAGNOSIS — M5431 Sciatica, right side: Secondary | ICD-10-CM | POA: Diagnosis not present

## 2022-08-20 DIAGNOSIS — M9903 Segmental and somatic dysfunction of lumbar region: Secondary | ICD-10-CM | POA: Diagnosis not present

## 2022-08-25 DIAGNOSIS — M5417 Radiculopathy, lumbosacral region: Secondary | ICD-10-CM | POA: Diagnosis not present

## 2022-08-25 DIAGNOSIS — M9905 Segmental and somatic dysfunction of pelvic region: Secondary | ICD-10-CM | POA: Diagnosis not present

## 2022-08-25 DIAGNOSIS — M5431 Sciatica, right side: Secondary | ICD-10-CM | POA: Diagnosis not present

## 2022-08-25 DIAGNOSIS — M9903 Segmental and somatic dysfunction of lumbar region: Secondary | ICD-10-CM | POA: Diagnosis not present

## 2022-08-27 DIAGNOSIS — M5417 Radiculopathy, lumbosacral region: Secondary | ICD-10-CM | POA: Diagnosis not present

## 2022-08-27 DIAGNOSIS — M5431 Sciatica, right side: Secondary | ICD-10-CM | POA: Diagnosis not present

## 2022-08-27 DIAGNOSIS — M9903 Segmental and somatic dysfunction of lumbar region: Secondary | ICD-10-CM | POA: Diagnosis not present

## 2022-08-27 DIAGNOSIS — M9905 Segmental and somatic dysfunction of pelvic region: Secondary | ICD-10-CM | POA: Diagnosis not present

## 2022-09-03 DIAGNOSIS — M9903 Segmental and somatic dysfunction of lumbar region: Secondary | ICD-10-CM | POA: Diagnosis not present

## 2022-09-03 DIAGNOSIS — M9905 Segmental and somatic dysfunction of pelvic region: Secondary | ICD-10-CM | POA: Diagnosis not present

## 2022-09-03 DIAGNOSIS — M5431 Sciatica, right side: Secondary | ICD-10-CM | POA: Diagnosis not present

## 2022-09-03 DIAGNOSIS — M5417 Radiculopathy, lumbosacral region: Secondary | ICD-10-CM | POA: Diagnosis not present

## 2022-09-10 DIAGNOSIS — M5417 Radiculopathy, lumbosacral region: Secondary | ICD-10-CM | POA: Diagnosis not present

## 2022-09-10 DIAGNOSIS — M9903 Segmental and somatic dysfunction of lumbar region: Secondary | ICD-10-CM | POA: Diagnosis not present

## 2022-09-10 DIAGNOSIS — M5431 Sciatica, right side: Secondary | ICD-10-CM | POA: Diagnosis not present

## 2022-09-10 DIAGNOSIS — M9905 Segmental and somatic dysfunction of pelvic region: Secondary | ICD-10-CM | POA: Diagnosis not present

## 2022-09-16 DIAGNOSIS — M9903 Segmental and somatic dysfunction of lumbar region: Secondary | ICD-10-CM | POA: Diagnosis not present

## 2022-09-16 DIAGNOSIS — M5431 Sciatica, right side: Secondary | ICD-10-CM | POA: Diagnosis not present

## 2022-09-16 DIAGNOSIS — M5417 Radiculopathy, lumbosacral region: Secondary | ICD-10-CM | POA: Diagnosis not present

## 2022-09-16 DIAGNOSIS — M9905 Segmental and somatic dysfunction of pelvic region: Secondary | ICD-10-CM | POA: Diagnosis not present

## 2022-09-24 DIAGNOSIS — M9903 Segmental and somatic dysfunction of lumbar region: Secondary | ICD-10-CM | POA: Diagnosis not present

## 2022-09-24 DIAGNOSIS — M5417 Radiculopathy, lumbosacral region: Secondary | ICD-10-CM | POA: Diagnosis not present

## 2022-09-24 DIAGNOSIS — M5431 Sciatica, right side: Secondary | ICD-10-CM | POA: Diagnosis not present

## 2022-09-24 DIAGNOSIS — M9905 Segmental and somatic dysfunction of pelvic region: Secondary | ICD-10-CM | POA: Diagnosis not present

## 2022-10-08 DIAGNOSIS — M9905 Segmental and somatic dysfunction of pelvic region: Secondary | ICD-10-CM | POA: Diagnosis not present

## 2022-10-08 DIAGNOSIS — M26621 Arthralgia of right temporomandibular joint: Secondary | ICD-10-CM | POA: Diagnosis not present

## 2022-10-08 DIAGNOSIS — M5417 Radiculopathy, lumbosacral region: Secondary | ICD-10-CM | POA: Diagnosis not present

## 2022-10-08 DIAGNOSIS — M5431 Sciatica, right side: Secondary | ICD-10-CM | POA: Diagnosis not present

## 2022-10-08 DIAGNOSIS — M26622 Arthralgia of left temporomandibular joint: Secondary | ICD-10-CM | POA: Diagnosis not present

## 2022-10-08 DIAGNOSIS — M9903 Segmental and somatic dysfunction of lumbar region: Secondary | ICD-10-CM | POA: Diagnosis not present

## 2022-10-13 DIAGNOSIS — D2261 Melanocytic nevi of right upper limb, including shoulder: Secondary | ICD-10-CM | POA: Diagnosis not present

## 2022-10-13 DIAGNOSIS — L821 Other seborrheic keratosis: Secondary | ICD-10-CM | POA: Diagnosis not present

## 2022-10-13 DIAGNOSIS — D224 Melanocytic nevi of scalp and neck: Secondary | ICD-10-CM | POA: Diagnosis not present

## 2022-10-13 DIAGNOSIS — D225 Melanocytic nevi of trunk: Secondary | ICD-10-CM | POA: Diagnosis not present

## 2022-10-29 DIAGNOSIS — M26621 Arthralgia of right temporomandibular joint: Secondary | ICD-10-CM | POA: Diagnosis not present

## 2022-10-29 DIAGNOSIS — M26622 Arthralgia of left temporomandibular joint: Secondary | ICD-10-CM | POA: Diagnosis not present

## 2022-10-29 DIAGNOSIS — M5431 Sciatica, right side: Secondary | ICD-10-CM | POA: Diagnosis not present

## 2022-10-29 DIAGNOSIS — M9903 Segmental and somatic dysfunction of lumbar region: Secondary | ICD-10-CM | POA: Diagnosis not present

## 2022-10-29 DIAGNOSIS — M5417 Radiculopathy, lumbosacral region: Secondary | ICD-10-CM | POA: Diagnosis not present

## 2022-10-29 DIAGNOSIS — M9905 Segmental and somatic dysfunction of pelvic region: Secondary | ICD-10-CM | POA: Diagnosis not present

## 2022-11-04 DIAGNOSIS — I10 Essential (primary) hypertension: Secondary | ICD-10-CM | POA: Diagnosis not present

## 2022-11-04 DIAGNOSIS — R051 Acute cough: Secondary | ICD-10-CM | POA: Diagnosis not present

## 2022-11-17 ENCOUNTER — Emergency Department (HOSPITAL_COMMUNITY)
Admission: EM | Admit: 2022-11-17 | Discharge: 2022-11-17 | Disposition: A | Discharge status: Home / Self Care | Payer: BC Managed Care – PPO | Attending: Emergency Medicine | Admitting: Emergency Medicine

## 2022-11-17 ENCOUNTER — Other Ambulatory Visit: Payer: Self-pay

## 2022-11-17 DIAGNOSIS — H6092 Unspecified otitis externa, left ear: Secondary | ICD-10-CM | POA: Diagnosis not present

## 2022-11-17 DIAGNOSIS — H60502 Unspecified acute noninfective otitis externa, left ear: Secondary | ICD-10-CM | POA: Diagnosis not present

## 2022-11-17 DIAGNOSIS — H9202 Otalgia, left ear: Secondary | ICD-10-CM | POA: Diagnosis not present

## 2022-11-17 MED ORDER — OXYCODONE-ACETAMINOPHEN 5-325 MG PO TABS
1.0000 | ORAL_TABLET | ORAL | Status: DC | PRN
  Administered 2022-11-17: 1 via ORAL
  Filled 2022-11-17: qty 1

## 2022-11-17 MED ORDER — CIPROFLOXACIN-DEXAMETHASONE 0.3-0.1 % OT SUSP
4.0000 [drp] | Freq: Once | OTIC | Status: AC
  Administered 2022-11-17: 4 [drp] via OTIC
  Filled 2022-11-17: qty 7.5

## 2022-11-17 MED ORDER — IBUPROFEN 800 MG PO TABS
800.0000 mg | ORAL_TABLET | Freq: Once | ORAL | Status: AC
  Administered 2022-11-17: 800 mg via ORAL
  Filled 2022-11-17: qty 1

## 2022-11-17 MED ORDER — HYDROCODONE-ACETAMINOPHEN 5-325 MG PO TABS: 1.0000 | ORAL_TABLET | ORAL | 0 refills | Status: AC | PRN

## 2022-11-17 NOTE — ED Provider Notes (Signed)
[facility] EMERGENCY DEPARTMENT Provider Note   CSN: [id number] Arrival date & time: 11/17/22  0757     History  Chief Complaint  Patient presents with   Otalgia    Casey Perez is a 52 y.o. female.  52 year old female with complaint of left ear pain onset around 11/04/22 following URI. Seen by PCP who diagnosed OM, prescribed Omnicef and prednisone. Pain was improving, then returned. Call to PCP office yesterday who called in Offloxcin 0.3% 10 drops to the ear once daily. Patient used the drops but continues to have severe pain in the ER prompting visit today.  No drainage from the ear, no fevers.  Denies dental issues.       Home Medications Prior to Admission medications   Medication Sig Start Date End Date Taking? Authorizing Provider  HYDROcodone-acetaminophen (NORCO/VICODIN) 5-325 MG tablet Take 1 tablet by mouth every 4 (four) hours as needed. 11/17/22  Yes Tacy Learn, PA-C  cetirizine (ZYRTEC) 10 MG tablet Take 10 mg by mouth daily.    [provider]  FOLIC ACID PO Take 1 tablet by mouth daily.    [provider]  hydrocortisone-pramoxine [facility]) rectal foam Place 1 applicator rectally 2 (two) times daily.    [provider]  ibuprofen (ADVIL,MOTRIN) 200 MG tablet Take 200 mg by mouth every 6 (six) hours as needed.    [provider]  lidocaine (XYLOCAINE JELLY) 2 % jelly Apply topically as needed. 09/21/12   Cornett, Marcello Moores, MD  oxyCODONE-acetaminophen (ROXICET) 5-325 MG per tablet Take 1 tablet by mouth every 4 (four) hours as needed for pain. 09/21/12   Cornett, Marcello Moores, MD  polyethylene glycol powder (GLYCOLAX) powder Take 17 g by mouth daily. 09/21/12   Cornett, Marcello Moores, MD  pramoxine-hydrocortisone (ANALPRAM HC) cream Apply topically 3 (three) times daily.    [provider]  predniSONE (STERAPRED UNI-PAK) 10 MG tablet Take 1 tablet (10 mg total) by mouth daily. Take 4 tablets today then 3 tablets  for 2 days then 2 tablets until gone 11/02/12   Cornett, Marcello Moores, MD  traMADol (ULTRAM) 50 MG tablet Take 1 tablet (50 mg total) by mouth every 6 (six) hours as needed for pain. 09/21/12   Erroll Luna, MD      Allergies    Sulfa antibiotics    Review of Systems   Review of Systems Negative except as per HPI Physical Exam Updated Vital Signs BP 123/82 (BP Location: Right Arm)   Pulse (!) 119   Temp 98.3 F (36.8 C) (Oral)   Resp 16   SpO2 99%  Physical Exam Vitals and nursing note reviewed.  Constitutional:      General: She is not in acute distress.    Appearance: She is well-developed. She is not diaphoretic.  HENT:     Head: Normocephalic and atraumatic.     Right Ear: Tympanic membrane and ear canal normal.     Left Ear: Swelling and tenderness present. No drainage. A middle ear effusion is present. No mastoid tenderness. Tympanic membrane is not injected, perforated, erythematous or bulging.     Nose: Congestion present.     Mouth/Throat:     Mouth: Mucous membranes are moist.  Cardiovascular:     Rate and Rhythm: Normal rate and regular rhythm.     Heart sounds: Normal heart sounds.  Pulmonary:     Effort: Pulmonary effort is normal.     Breath sounds: Normal breath sounds.  Lymphadenopathy:  Cervical: No cervical adenopathy.  Skin:    General: Skin is warm and dry.     Findings: No erythema or rash.  Neurological:     Mental Status: She is alert and oriented to person, place, and time.  Psychiatric:        Behavior: Behavior normal.     ED Results / Procedures / Treatments   Labs (all labs ordered are listed, but only abnormal results are displayed) Labs Reviewed - No data to display  EKG None  Radiology No results found.  Procedures Procedures    Medications Ordered in ED Medications  oxyCODONE-acetaminophen (PERCOCET/ROXICET) 5-325 MG per tablet 1 tablet (1 tablet Oral Given 11/17/22 0828)  ciprofloxacin-dexamethasone (CIPRODEX) 0.3-0.1 %  OTIC (EAR) suspension 4 drop (4 drops Left EAR Given by Other 11/17/22 1214)  ibuprofen (ADVIL) tablet 800 mg (800 mg Oral Given 11/17/22 1215)    ED Course/ Medical Decision Making/ A&P                           Medical Decision Making Risk Prescription drug management.   52 year old female presents with complaint of left ear pain as above.  On exam is found to have swelling of the right ear canal which is very tender, tender with manipulation of the external ear.  No tenderness over the mastoid process.  No drainage.  Persistent left TM effusion without bulging TM or erythema.  Suspect likely otitis externa.  Patient to start ofloxacin drops yesterday has had 1 dose.  A wick was placed in the patient's left ear canal with Ciprodex drops.  She is discharged with bottle to continue for drops twice daily.  Provided with prescription for Norco for her pain and encouraged to take Motrin as well.  Provided with referral to ENT (request specifically Dr. Wilburn Cornelia) also provided with ENT on-call.        Final Clinical Impression(s) / ED Diagnoses Final diagnoses:  Acute otitis externa of left ear, unspecified type    Rx / DC Orders ED Discharge Orders          Ordered    HYDROcodone-acetaminophen (NORCO/VICODIN) 5-325 MG tablet  Every 4 hours PRN        11/17/22 1155              Tacy Learn, PA-C 11/17/22 1425    [place], [place] K, DO 11/17/22 1556

## 2022-11-17 NOTE — Discharge Instructions (Addendum)
Place 4 drops in the left ear twice daily. Gently mix bottle before applying.  Continue with Motrin as directed for pain. Norco as needed for pain not controlled with Motrin.  Heating pad for 20 minutes at a time.  Follow up with PCP or ENT for recheck. Remove wick from ear Wednesday morning if it hasn't fallen out.  Information provided for Dr. Victorio Palm office as requested.  Also provided with information for Dr. Redmond Baseman ENT on-call.

## 2022-11-17 NOTE — ED Notes (Signed)
Pt verbalizes understanding of discharge instructions. Opportunity for questions and answers were provided. Pt discharged from the ED.   ?

## 2022-11-17 NOTE — ED Triage Notes (Signed)
Pt here with reports of L sided otalgia. States was started on cefdinir and prednisone and initially there was improvement. Pt states yesterday the pain came back worse than before. Pt tearful in triage.

## 2022-11-18 DIAGNOSIS — H60332 Swimmer's ear, left ear: Secondary | ICD-10-CM | POA: Diagnosis not present

## 2023-02-16 DIAGNOSIS — E042 Nontoxic multinodular goiter: Secondary | ICD-10-CM | POA: Diagnosis not present

## 2023-02-16 DIAGNOSIS — E785 Hyperlipidemia, unspecified: Secondary | ICD-10-CM | POA: Diagnosis not present

## 2023-02-16 DIAGNOSIS — I10 Essential (primary) hypertension: Secondary | ICD-10-CM | POA: Diagnosis not present

## 2023-02-16 DIAGNOSIS — Z0189 Encounter for other specified special examinations: Secondary | ICD-10-CM | POA: Diagnosis not present

## 2023-03-02 DIAGNOSIS — R82998 Other abnormal findings in urine: Secondary | ICD-10-CM | POA: Diagnosis not present

## 2023-03-02 DIAGNOSIS — Z1331 Encounter for screening for depression: Secondary | ICD-10-CM | POA: Diagnosis not present

## 2023-03-02 DIAGNOSIS — Z0000 Encounter for general adult medical examination without abnormal findings: Secondary | ICD-10-CM | POA: Diagnosis not present

## 2023-03-02 DIAGNOSIS — E785 Hyperlipidemia, unspecified: Secondary | ICD-10-CM | POA: Diagnosis not present

## 2023-03-02 DIAGNOSIS — F17200 Nicotine dependence, unspecified, uncomplicated: Secondary | ICD-10-CM | POA: Diagnosis not present

## 2023-03-02 DIAGNOSIS — I10 Essential (primary) hypertension: Secondary | ICD-10-CM | POA: Diagnosis not present

## 2023-03-02 DIAGNOSIS — L308 Other specified dermatitis: Secondary | ICD-10-CM | POA: Diagnosis not present

## 2023-03-23 DIAGNOSIS — D2222 Melanocytic nevi of left ear and external auricular canal: Secondary | ICD-10-CM | POA: Diagnosis not present

## 2023-03-23 DIAGNOSIS — L710 Perioral dermatitis: Secondary | ICD-10-CM | POA: Diagnosis not present

## 2023-03-23 DIAGNOSIS — D2262 Melanocytic nevi of left upper limb, including shoulder: Secondary | ICD-10-CM | POA: Diagnosis not present

## 2023-03-23 DIAGNOSIS — D2239 Melanocytic nevi of other parts of face: Secondary | ICD-10-CM | POA: Diagnosis not present

## 2023-04-16 DIAGNOSIS — M5417 Radiculopathy, lumbosacral region: Secondary | ICD-10-CM | POA: Diagnosis not present

## 2023-04-16 DIAGNOSIS — M26621 Arthralgia of right temporomandibular joint: Secondary | ICD-10-CM | POA: Diagnosis not present

## 2023-04-16 DIAGNOSIS — M5431 Sciatica, right side: Secondary | ICD-10-CM | POA: Diagnosis not present

## 2023-04-16 DIAGNOSIS — M9905 Segmental and somatic dysfunction of pelvic region: Secondary | ICD-10-CM | POA: Diagnosis not present

## 2023-04-16 DIAGNOSIS — M26622 Arthralgia of left temporomandibular joint: Secondary | ICD-10-CM | POA: Diagnosis not present

## 2023-04-16 DIAGNOSIS — M9903 Segmental and somatic dysfunction of lumbar region: Secondary | ICD-10-CM | POA: Diagnosis not present

## 2023-05-14 DIAGNOSIS — M9905 Segmental and somatic dysfunction of pelvic region: Secondary | ICD-10-CM | POA: Diagnosis not present

## 2023-05-14 DIAGNOSIS — M9903 Segmental and somatic dysfunction of lumbar region: Secondary | ICD-10-CM | POA: Diagnosis not present

## 2023-05-14 DIAGNOSIS — M5417 Radiculopathy, lumbosacral region: Secondary | ICD-10-CM | POA: Diagnosis not present

## 2023-05-14 DIAGNOSIS — M26621 Arthralgia of right temporomandibular joint: Secondary | ICD-10-CM | POA: Diagnosis not present

## 2023-05-14 DIAGNOSIS — M26622 Arthralgia of left temporomandibular joint: Secondary | ICD-10-CM | POA: Diagnosis not present

## 2023-05-14 DIAGNOSIS — M5431 Sciatica, right side: Secondary | ICD-10-CM | POA: Diagnosis not present

## 2023-06-11 DIAGNOSIS — M26622 Arthralgia of left temporomandibular joint: Secondary | ICD-10-CM | POA: Diagnosis not present

## 2023-06-11 DIAGNOSIS — M26621 Arthralgia of right temporomandibular joint: Secondary | ICD-10-CM | POA: Diagnosis not present

## 2023-06-11 DIAGNOSIS — M5417 Radiculopathy, lumbosacral region: Secondary | ICD-10-CM | POA: Diagnosis not present

## 2023-06-11 DIAGNOSIS — M9903 Segmental and somatic dysfunction of lumbar region: Secondary | ICD-10-CM | POA: Diagnosis not present

## 2023-06-11 DIAGNOSIS — M9905 Segmental and somatic dysfunction of pelvic region: Secondary | ICD-10-CM | POA: Diagnosis not present

## 2023-06-11 DIAGNOSIS — M5431 Sciatica, right side: Secondary | ICD-10-CM | POA: Diagnosis not present

## 2023-07-06 DIAGNOSIS — L603 Nail dystrophy: Secondary | ICD-10-CM | POA: Diagnosis not present

## 2023-07-09 DIAGNOSIS — M9905 Segmental and somatic dysfunction of pelvic region: Secondary | ICD-10-CM | POA: Diagnosis not present

## 2023-07-09 DIAGNOSIS — M26621 Arthralgia of right temporomandibular joint: Secondary | ICD-10-CM | POA: Diagnosis not present

## 2023-07-09 DIAGNOSIS — M5431 Sciatica, right side: Secondary | ICD-10-CM | POA: Diagnosis not present

## 2023-07-09 DIAGNOSIS — M26622 Arthralgia of left temporomandibular joint: Secondary | ICD-10-CM | POA: Diagnosis not present

## 2023-07-09 DIAGNOSIS — M5417 Radiculopathy, lumbosacral region: Secondary | ICD-10-CM | POA: Diagnosis not present

## 2023-07-09 DIAGNOSIS — M9903 Segmental and somatic dysfunction of lumbar region: Secondary | ICD-10-CM | POA: Diagnosis not present

## 2023-08-03 DIAGNOSIS — M5431 Sciatica, right side: Secondary | ICD-10-CM | POA: Diagnosis not present

## 2023-08-03 DIAGNOSIS — M26621 Arthralgia of right temporomandibular joint: Secondary | ICD-10-CM | POA: Diagnosis not present

## 2023-08-03 DIAGNOSIS — M9903 Segmental and somatic dysfunction of lumbar region: Secondary | ICD-10-CM | POA: Diagnosis not present

## 2023-08-03 DIAGNOSIS — M26622 Arthralgia of left temporomandibular joint: Secondary | ICD-10-CM | POA: Diagnosis not present

## 2023-08-03 DIAGNOSIS — M5417 Radiculopathy, lumbosacral region: Secondary | ICD-10-CM | POA: Diagnosis not present

## 2023-08-03 DIAGNOSIS — M9905 Segmental and somatic dysfunction of pelvic region: Secondary | ICD-10-CM | POA: Diagnosis not present

## 2023-09-01 DIAGNOSIS — M5431 Sciatica, right side: Secondary | ICD-10-CM | POA: Diagnosis not present

## 2023-09-01 DIAGNOSIS — M9905 Segmental and somatic dysfunction of pelvic region: Secondary | ICD-10-CM | POA: Diagnosis not present

## 2023-09-01 DIAGNOSIS — M9903 Segmental and somatic dysfunction of lumbar region: Secondary | ICD-10-CM | POA: Diagnosis not present

## 2023-09-01 DIAGNOSIS — M26622 Arthralgia of left temporomandibular joint: Secondary | ICD-10-CM | POA: Diagnosis not present

## 2023-09-01 DIAGNOSIS — M26621 Arthralgia of right temporomandibular joint: Secondary | ICD-10-CM | POA: Diagnosis not present

## 2023-09-01 DIAGNOSIS — M5417 Radiculopathy, lumbosacral region: Secondary | ICD-10-CM | POA: Diagnosis not present

## 2023-09-29 DIAGNOSIS — M9903 Segmental and somatic dysfunction of lumbar region: Secondary | ICD-10-CM | POA: Diagnosis not present

## 2023-09-29 DIAGNOSIS — M26622 Arthralgia of left temporomandibular joint: Secondary | ICD-10-CM | POA: Diagnosis not present

## 2023-09-29 DIAGNOSIS — M5417 Radiculopathy, lumbosacral region: Secondary | ICD-10-CM | POA: Diagnosis not present

## 2023-09-29 DIAGNOSIS — M9905 Segmental and somatic dysfunction of pelvic region: Secondary | ICD-10-CM | POA: Diagnosis not present

## 2023-09-29 DIAGNOSIS — M5431 Sciatica, right side: Secondary | ICD-10-CM | POA: Diagnosis not present

## 2023-09-29 DIAGNOSIS — M26621 Arthralgia of right temporomandibular joint: Secondary | ICD-10-CM | POA: Diagnosis not present

## 2023-11-02 DIAGNOSIS — M26621 Arthralgia of right temporomandibular joint: Secondary | ICD-10-CM | POA: Diagnosis not present

## 2023-11-02 DIAGNOSIS — M9905 Segmental and somatic dysfunction of pelvic region: Secondary | ICD-10-CM | POA: Diagnosis not present

## 2023-11-02 DIAGNOSIS — M26622 Arthralgia of left temporomandibular joint: Secondary | ICD-10-CM | POA: Diagnosis not present

## 2023-11-02 DIAGNOSIS — M5431 Sciatica, right side: Secondary | ICD-10-CM | POA: Diagnosis not present

## 2023-11-02 DIAGNOSIS — M9903 Segmental and somatic dysfunction of lumbar region: Secondary | ICD-10-CM | POA: Diagnosis not present

## 2023-11-02 DIAGNOSIS — M5417 Radiculopathy, lumbosacral region: Secondary | ICD-10-CM | POA: Diagnosis not present

## 2023-11-16 DIAGNOSIS — L603 Nail dystrophy: Secondary | ICD-10-CM | POA: Diagnosis not present

## 2023-11-30 DIAGNOSIS — M5431 Sciatica, right side: Secondary | ICD-10-CM | POA: Diagnosis not present

## 2023-11-30 DIAGNOSIS — M9905 Segmental and somatic dysfunction of pelvic region: Secondary | ICD-10-CM | POA: Diagnosis not present

## 2023-11-30 DIAGNOSIS — M9903 Segmental and somatic dysfunction of lumbar region: Secondary | ICD-10-CM | POA: Diagnosis not present

## 2023-11-30 DIAGNOSIS — M26622 Arthralgia of left temporomandibular joint: Secondary | ICD-10-CM | POA: Diagnosis not present

## 2023-11-30 DIAGNOSIS — M5417 Radiculopathy, lumbosacral region: Secondary | ICD-10-CM | POA: Diagnosis not present

## 2023-11-30 DIAGNOSIS — M26621 Arthralgia of right temporomandibular joint: Secondary | ICD-10-CM | POA: Diagnosis not present

## 2024-03-02 ENCOUNTER — Other Ambulatory Visit: Payer: Self-pay | Admitting: Internal Medicine

## 2024-03-02 DIAGNOSIS — E042 Nontoxic multinodular goiter: Secondary | ICD-10-CM | POA: Diagnosis not present

## 2024-03-02 DIAGNOSIS — E785 Hyperlipidemia, unspecified: Secondary | ICD-10-CM | POA: Diagnosis not present

## 2024-03-02 DIAGNOSIS — R82998 Other abnormal findings in urine: Secondary | ICD-10-CM | POA: Diagnosis not present

## 2024-03-02 DIAGNOSIS — I10 Essential (primary) hypertension: Secondary | ICD-10-CM | POA: Diagnosis not present

## 2024-03-02 DIAGNOSIS — Z1331 Encounter for screening for depression: Secondary | ICD-10-CM | POA: Diagnosis not present

## 2024-03-02 DIAGNOSIS — Z0000 Encounter for general adult medical examination without abnormal findings: Secondary | ICD-10-CM | POA: Diagnosis not present

## 2024-03-10 ENCOUNTER — Ambulatory Visit
Admission: RE | Admit: 2024-03-10 | Discharge: 2024-03-10 | Disposition: A | Discharge status: Home / Self Care | Source: Ambulatory Visit | Attending: Internal Medicine | Admitting: Internal Medicine

## 2024-03-10 DIAGNOSIS — E042 Nontoxic multinodular goiter: Secondary | ICD-10-CM

## 2024-03-10 DIAGNOSIS — E041 Nontoxic single thyroid nodule: Secondary | ICD-10-CM | POA: Diagnosis not present

## 2024-08-29 ENCOUNTER — Other Ambulatory Visit: Payer: Self-pay | Admitting: Registered Nurse

## 2024-08-29 ENCOUNTER — Other Ambulatory Visit (HOSPITAL_BASED_OUTPATIENT_CLINIC_OR_DEPARTMENT_OTHER): Payer: Self-pay | Admitting: Registered Nurse

## 2024-08-29 DIAGNOSIS — R1030 Lower abdominal pain, unspecified: Secondary | ICD-10-CM

## 2024-08-29 DIAGNOSIS — R110 Nausea: Secondary | ICD-10-CM

## 2024-08-29 DIAGNOSIS — K5909 Other constipation: Secondary | ICD-10-CM

## 2024-08-29 DIAGNOSIS — R509 Fever, unspecified: Secondary | ICD-10-CM

## 2024-08-29 DIAGNOSIS — R531 Weakness: Secondary | ICD-10-CM

## 2024-08-30 ENCOUNTER — Ambulatory Visit
Admission: RE | Admit: 2024-08-30 | Discharge: 2024-08-30 | Disposition: A | Discharge status: Home / Self Care | Source: Ambulatory Visit | Attending: Registered Nurse | Admitting: Registered Nurse

## 2024-08-30 ENCOUNTER — Encounter: Payer: Self-pay | Admitting: Radiology

## 2024-08-30 DIAGNOSIS — R110 Nausea: Secondary | ICD-10-CM | POA: Diagnosis present

## 2024-08-30 DIAGNOSIS — K5909 Other constipation: Secondary | ICD-10-CM | POA: Diagnosis present

## 2024-08-30 DIAGNOSIS — R509 Fever, unspecified: Secondary | ICD-10-CM | POA: Insufficient documentation

## 2024-08-30 DIAGNOSIS — R1030 Lower abdominal pain, unspecified: Secondary | ICD-10-CM | POA: Diagnosis present

## 2024-08-30 DIAGNOSIS — R531 Weakness: Secondary | ICD-10-CM | POA: Diagnosis present

## 2024-08-30 MED ORDER — IOHEXOL 300 MG/ML  SOLN
100.0000 mL | Freq: Once | INTRAMUSCULAR | Status: AC | PRN
  Administered 2024-08-30: 100 mL via INTRAVENOUS

## 2024-08-30 MED ORDER — IOHEXOL 9 MG/ML PO SOLN
500.0000 mL | ORAL | Status: AC
  Administered 2024-08-30 (×2): 500 mL via ORAL
